# Patient Record
Sex: Male | Born: 1940
Health system: Southern US, Community
[De-identification: ages and names within clinical notes are randomized; demographics above are authoritative.]

## PROBLEM LIST (undated history)

## (undated) DIAGNOSIS — E079 Disorder of thyroid, unspecified: Secondary | ICD-10-CM

## (undated) DIAGNOSIS — I10 Essential (primary) hypertension: Secondary | ICD-10-CM

## (undated) DIAGNOSIS — K219 Gastro-esophageal reflux disease without esophagitis: Secondary | ICD-10-CM

## (undated) DIAGNOSIS — E039 Hypothyroidism, unspecified: Secondary | ICD-10-CM

## (undated) DIAGNOSIS — R0789 Other chest pain: Secondary | ICD-10-CM

## (undated) DIAGNOSIS — N400 Enlarged prostate without lower urinary tract symptoms: Secondary | ICD-10-CM

## (undated) DIAGNOSIS — M1990 Unspecified osteoarthritis, unspecified site: Secondary | ICD-10-CM

## (undated) DIAGNOSIS — E785 Hyperlipidemia, unspecified: Secondary | ICD-10-CM

## (undated) HISTORY — DX: Unspecified osteoarthritis, unspecified site: M19.90

## (undated) HISTORY — PX: PROSTATE BIOPSY: SHX241

## (undated) HISTORY — DX: Disorder of thyroid, unspecified: E07.9

## (undated) HISTORY — PX: COLONOSCOPY W/ POLYPECTOMY: SHX1380

## (undated) HISTORY — PX: BIOPSY SHOULDER: PRO31

## (undated) HISTORY — DX: Hyperlipidemia, unspecified: E78.5

## (undated) HISTORY — DX: Gastro-esophageal reflux disease without esophagitis: K21.9

## (undated) HISTORY — PX: CARDIAC CATHETERIZATION: SHX172

## (undated) HISTORY — DX: Essential (primary) hypertension: I10

---

## 1999-04-30 ENCOUNTER — Inpatient Hospital Stay (HOSPITAL_COMMUNITY): Admission: EM | Admit: 1999-04-30 | Discharge: 1999-05-03 | Payer: Self-pay | Admitting: Emergency Medicine

## 1999-04-30 ENCOUNTER — Encounter: Payer: Self-pay | Admitting: Emergency Medicine

## 1999-05-01 ENCOUNTER — Encounter: Payer: Self-pay | Admitting: Neurosurgery

## 1999-05-02 ENCOUNTER — Encounter: Payer: Self-pay | Admitting: Neurosurgery

## 1999-06-04 ENCOUNTER — Encounter: Payer: Self-pay | Admitting: Neurosurgery

## 1999-06-04 ENCOUNTER — Encounter: Admission: RE | Admit: 1999-06-04 | Discharge: 1999-06-04 | Payer: Self-pay | Admitting: Neurosurgery

## 1999-06-08 ENCOUNTER — Other Ambulatory Visit: Admission: RE | Admit: 1999-06-08 | Discharge: 1999-06-08 | Payer: Self-pay | Admitting: Urology

## 1999-07-02 ENCOUNTER — Encounter: Payer: Self-pay | Admitting: Neurosurgery

## 1999-07-02 ENCOUNTER — Encounter: Admission: RE | Admit: 1999-07-02 | Discharge: 1999-07-02 | Payer: Self-pay | Admitting: Neurosurgery

## 1999-07-24 ENCOUNTER — Encounter: Admission: RE | Admit: 1999-07-24 | Discharge: 1999-07-24 | Payer: Self-pay | Admitting: Neurosurgery

## 1999-07-24 ENCOUNTER — Encounter: Payer: Self-pay | Admitting: Neurosurgery

## 1999-08-27 ENCOUNTER — Encounter: Payer: Self-pay | Admitting: Neurosurgery

## 1999-08-27 ENCOUNTER — Encounter: Admission: RE | Admit: 1999-08-27 | Discharge: 1999-08-27 | Payer: Self-pay | Admitting: Neurosurgery

## 2001-11-17 ENCOUNTER — Ambulatory Visit (HOSPITAL_COMMUNITY): Admission: RE | Admit: 2001-11-17 | Discharge: 2001-11-17 | Payer: Self-pay | Admitting: Gastroenterology

## 2001-11-17 ENCOUNTER — Encounter (INDEPENDENT_AMBULATORY_CARE_PROVIDER_SITE_OTHER): Payer: Self-pay | Admitting: Specialist

## 2001-11-17 ENCOUNTER — Encounter: Payer: Self-pay | Admitting: Gastroenterology

## 2001-11-17 DIAGNOSIS — K5730 Diverticulosis of large intestine without perforation or abscess without bleeding: Secondary | ICD-10-CM | POA: Insufficient documentation

## 2004-09-29 ENCOUNTER — Emergency Department (HOSPITAL_COMMUNITY): Admission: EM | Admit: 2004-09-29 | Discharge: 2004-09-29 | Payer: Self-pay | Admitting: Emergency Medicine

## 2004-10-15 ENCOUNTER — Inpatient Hospital Stay (HOSPITAL_BASED_OUTPATIENT_CLINIC_OR_DEPARTMENT_OTHER): Admission: RE | Admit: 2004-10-15 | Discharge: 2004-10-15 | Payer: Self-pay | Admitting: Cardiovascular Disease

## 2007-06-20 ENCOUNTER — Emergency Department (HOSPITAL_COMMUNITY): Admission: EM | Admit: 2007-06-20 | Discharge: 2007-06-20 | Payer: Self-pay | Admitting: Emergency Medicine

## 2007-06-24 ENCOUNTER — Ambulatory Visit: Payer: Self-pay | Admitting: Gastroenterology

## 2007-06-25 ENCOUNTER — Ambulatory Visit (HOSPITAL_COMMUNITY): Admission: RE | Admit: 2007-06-25 | Discharge: 2007-06-25 | Payer: Self-pay | Admitting: Gastroenterology

## 2007-06-25 ENCOUNTER — Encounter: Payer: Self-pay | Admitting: Gastroenterology

## 2007-06-29 ENCOUNTER — Ambulatory Visit: Payer: Self-pay | Admitting: Gastroenterology

## 2007-08-29 DIAGNOSIS — K449 Diaphragmatic hernia without obstruction or gangrene: Secondary | ICD-10-CM | POA: Insufficient documentation

## 2007-08-29 DIAGNOSIS — I10 Essential (primary) hypertension: Secondary | ICD-10-CM | POA: Insufficient documentation

## 2007-08-29 DIAGNOSIS — K219 Gastro-esophageal reflux disease without esophagitis: Secondary | ICD-10-CM | POA: Insufficient documentation

## 2007-08-29 DIAGNOSIS — D126 Benign neoplasm of colon, unspecified: Secondary | ICD-10-CM | POA: Insufficient documentation

## 2007-08-29 DIAGNOSIS — E039 Hypothyroidism, unspecified: Secondary | ICD-10-CM | POA: Insufficient documentation

## 2007-08-29 DIAGNOSIS — K648 Other hemorrhoids: Secondary | ICD-10-CM | POA: Insufficient documentation

## 2007-08-29 DIAGNOSIS — E7800 Pure hypercholesterolemia, unspecified: Secondary | ICD-10-CM | POA: Insufficient documentation

## 2010-12-18 NOTE — Consult Note (Signed)
NAME:  Hunter Key, SALSGIVER NO.:  [id number]   MEDICAL RECORD NO.:  [id number]          PATIENT TYPE:  EMS   LOCATION:  MAJO                         FACILITY:  [facility]   PHYSICIAN:  Gaspar Garbe, M.D.DATE OF BIRTH:  08-Oct-1940   DATE OF CONSULTATION:  DATE OF DISCHARGE:                                 CONSULTATION   REQUESTING PHYSICIAN:  Bethann Berkshire, MD, ER physician.   PRIMARY PHYSICIAN:  Dr. Wylene Simmer.   CHIEF COMPLAINT:  Abdominal/chest pain.   HISTORY OF PRESENT ILLNESS:  The patient is a 70 year old white male  with a history of recent onset gastroesophageal reflux disease.  I have  seen the patient for this in the office within the past 2 weeks.  At  that time, I recommended over-the-counter Prilosec and Zantac.  He is  somewhat taking these medications, but it has not made his symptoms go  away altogether.  He has had an EKG in the office which has been  negative.  He has also had negative EKG and cardiac enzymes here in the  hospital.  He indicates food seems like it gets stuck sometimes, and his  wife has been giving him herbal medicines to help with gas.  She  specifically brought him to the hospital today for the purpose of him  being evaluated by a gastroenterologist.  I indicated to her that given  that this is not an emergency situation, he would not having any sort of  procedural work done until at least Monday, and given that he is  otherwise healthy per workup below, that he does not fit criteria for  hospitalization.  He was essentially pain-free during the time of this  examination and consultation.   ALLERGIES:  No known drug allergies.   MEDICATIONS:  1. Synthroid 137 mcg q. day.  2. Hydrochlorothiazide 25 mg q. day.  3. Lipitor 10 mg 1. day.  4. Quinapril 20 mg p.o. q. day.   PAST MEDICAL HISTORY:  1. Hypertension.  2. Hypothyroidism.  3. Hyperlipidemia.  4. Recent GERD.   SOCIAL HISTORY:  The patient lives in [place] with  his wife.  He  works at a [employer].  He is a nonsmoker and very rare  drinker.   FAMILY HISTORY:  His mother died at age 43 of pneumonia.  Father died at  age 72 of a stroke.   REVIEW OF SYSTEMS:  The patient had complained of some chest pain and  GERD symptoms, but these have completely resolved at this point.  A 10-  point review of systems is otherwise negative.  He has asterixis.  The  patient is a full code.   PHYSICAL EXAM:  Temperature 98.2, pulse 103, respiratory rate 18, blood  pressure 126/82.  GENERAL:  No acute distress.  HEENT:  Normocephalic, atraumatic.  PERRLA.  EOMI.  ENT is within normal  limits.  NECK:  Supple.  No lymphadenopathy, JVD or bruit.  HEART:  Regular rate and rhythm.  No murmur, rub or gallop.  LUNGS:  Clear to auscultation bilaterally.  ABDOMEN:  Soft, nontender, normoactive bowel sounds.  No  hepatosplenomegaly.  EXTREMITIES:  No clubbing, cyanosis or edema.   EKG shows a rate of 113 with an old left anterior fascicular block.  No  other acuity is noted.  Cardiac enzymes:  myoglobin 116,  CK-MB less  than 1, troponin less 0.05.  White count 8.2 with no left shift,  hemoglobin 16.8, hematocrit 49.2, platelets 273.  BUN and creatinine are  20 and 1.3 respectively.  Sodium is slightly decreased at 134.  Potassium slightly decreased at 3.3.  This was replaced with 20 mEq of  p.o. potassium in the emergency room.  Bilirubin 1.6, AST 23, ALT 19,  lipase 20, total protein seven, albumin 4.1.   ASSESSMENT AND PLAN:  1. Gastroesophageal reflux disease.  I have written the patient a      prescription for Protonix 40 mg daily to take approximately 1/2      hour before his largest meal of the day.  He may use Maalox or      Mylanta as needed.  In addition, he has noted some frequent      increased frequency of bowel movements over the weekend.  I am not      sure whether he is getting a viral gastroenteritis, or if it is      related to some of  the nonabsorbable sugars which are in his anti-      gas over-the-counter medication, which his wife happened to have on-      hand.  I have asked them to discontinue this, as well as to hold      his hydrochlorothiazide for 3 days, as this may potentiate      hypokalemia as well.  I will have my nurse actually contact him on      Monday for referral for a gastroenterologist, as I believe he would      warrant an EGD, and I believe he may end up having a stricture      which requires dilatation.  I spoke at length with the wife about      guidelines for admission, and indicated that given his lack of      acuity, he would not be able to undergo one of these procedures      over the weekend.  She was agreeable to this and agreeable to take      home with followup.  2. Hypokalemia, possibly secondary to viral gastroenteritis or      supplement that the wife has been giving him.  I have asked them to      discontinue these.  Potassium was replaced in the emergency room.  3. Hypertension.  Continue of quinapril.  4. Hyperlipidemia.  Continue Lipitor.  5. Hypothyroidism.  Continue Synthroid.  The patient has had recent      check of TSH in the office.   Thank you for allowing me to consult on this patient.      Gaspar Garbe, M.D.  Electronically Signed     RWT/MEDQ  D:  06/20/2007  T:  06/22/2007  Job:  [id number]

## 2010-12-18 NOTE — Assessment & Plan Note (Signed)
[facility]                         GASTROENTEROLOGY OFFICE NOTE   Hunter Key, Hunter Key                      MRN:          [id number]  DATE:06/24/2007                            DOB:          [birth year]    PRIMARY CARE PHYSICIAN:  Dr. Wylene Simmer.   REASON FOR REFERRAL:  Dr. Wylene Simmer asked me to evaluate Hunter Key in  consultation regarding intermittent pyrosis and recent chest pains.   HISTORY OF PRESENT ILLNESS:  Hunter Key is a very pleasant 70 year old  man who has had at least 5 to 10 years of intermittent pyrosis, pressure  feeling in his chest, belching sensation.  In the past he has taken  baking soda and this will oftentimes help.  In the past 2 to 3 weeks it  seems to be worse.  He does admit that since he was laid off in July he  has been drinking quite a bit more caffeine than usual, and is drinking  much more alcohol than usual.  He stopped both of those habits about 2  to 3 weeks ago.  He did present to the emergency room in the past 2  weeks with acute diarrhea.  Dr. Wylene Simmer saw him while he was in the  emergency room, though he probably had a viral gastroenteritis with  recent worsening of gastroesophageal reflux disease symptoms.  He was  given prescription for Protonix, and he has been taking that on a daily  basis 20 to 30 minutes prior to his dinner meal.  He says with this  regimen of the Protonix on a daily basis and stopping the alcohol and  cutting back on caffeine dramatically, he has noticed a definite  improvement in his pyrosis and his chest pains.  He did have an EKG and  cardiac markers while he was in the emergency room and he tells me those  were normal.   REVIEW OF SYSTEMS:  Notable for stable weight, otherwise essentially  normal and are available on his nursing intake sheet.   PAST MEDICAL HISTORY:  1. Hypertension.  2. Elevated cholesterol.  3. Hypothyroidism.  4. Personal history of colon tubular adenomas.   Several polyps were      removed by Dr. Ritta Slot in 2003.  These were adenomatous.  Dr.      Kinnie Scales did a repeat colonoscopy last year and found, per the      patient, 2 more polyps, and he has arranged to see Dr. Kinnie Scales every      2 to 3 years for repeat colonoscopy.   CURRENT MEDICATIONS:  1. Synthroid.  2. Quinapril.  3. Lipitor.  4. Hydrochlorothiazide.  5. Pantoprazole.   ALLERGIES:  No known drug allergies.   SOCIAL HISTORY:  Married with 1 daughter.  Laid off in July.  Dramatic  increase in his caffeine and alcohol intake following being laid off,  but he quit both of those for the past 2 to 3 weeks.  Nonsmoker,  nondrinker currently.   FAMILY HISTORY:  Brothers have heart disease.  No colon cancer, colon  polyps in the family besides his own colon  polyps as described above.   PHYSICAL EXAMINATION:  Height 6 feet 2 inches, 186 pounds, blood  pressure 130/72, pulse 80.  CONSTITUTIONAL:  Generally well appearing.  NEUROLOGIC:  Alert and oriented x3.  EYES:  Extraocular movements intact.  MOUTH:  Oropharynx moist, no lesions.  NECK:  Supple, no lymphadenopathy.  CARDIOVASCULAR:  Heart regular rate and rhythm.  LUNGS:  Clear to auscultation bilaterally.  ABDOMEN:  Soft, nontender, nondistended, normal bowel sounds.  EXTREMITIES:  No lower extremity edema.  SKIN:  No rashes or lesions on visible extremities.   ASSESSMENT AND PLAN:  A 70 year old man with chronic gastroesophageal  reflux disease symptoms, worse recently.   First, his alcohol and caffeine intake since he was laid off probably  did play a role in his recent worsening of his gastroesophageal reflux  disease symptoms.  He has cut back on both of those habits, and does not  think it would be hard for him to completely avoid alcohol.  I suspect  that his pyrosis and probably most of his chest pains are  gastroesophageal reflux disease related.  He will continue to avoid the  alcohol and caffeine.  I think  he should continue on the proton pump  inhibitor 20 to 30 minutes prior to his dinner meal as he has been  taking lately as it seems very effective.  I will arrange for him to  have an esophagogastroduodenoscopy performed at his soonest convenience  to screen him for chronic complications of gastroesophageal reflux  disease such as Barrett's or stricturing.  I did not mention above, but  he has no dysphagia, so I doubt he will have any peptic stricturing.  He  also has had recent labs in the past week or two showing that he is not  anemic.  Hunter Key has had personal history of colon polyps, and his  last colonoscopy was in 2007 by Dr. Kinnie Scales, and Hunter Key wishes to  remain with Dr. Kinnie Scales for future colonoscopy care, so we will leave  that issue to Dr. Kinnie Scales and I will forward this report to Dr. Kinnie Scales as  well.  I see no return for any further blood tests or imaging studies.  Hunter Key has been given a gastroesophageal reflux disease handout.     Rachael Fee, MD  Electronically Signed    DPJ/MedQ  DD: 06/24/2007  DT: 06/24/2007  Job #: [id number]   cc:   Gaspar Garbe, M.D.  Griffith Citron, M.D.

## 2010-12-21 NOTE — Cardiovascular Report (Signed)
NAME:  SIMRAN, MANNIS NO.:  [id number]   MEDICAL RECORD NO.:  [id number]          PATIENT TYPE:  OIB   LOCATION:  6501                         FACILITY:  [facility]   PHYSICIAN:  Vesta Mixer, M.D. DATE OF BIRTH:  22-Jun-1941   DATE OF PROCEDURE:  10/15/2004  DATE OF DISCHARGE:                              CARDIAC CATHETERIZATION   Mr. Huckins is a 63-year gentleman. He was originally referred for some left  arm discomfort and chest discomfort. He had a stress Cardiolite study which  revealed evidence of reversible ischemia in the inferoseptal region. He is  referred for heart catheterization based on these findings.   PROCEDURE:  Left heart catheterization with coronary angiography. The right  femoral artery was easily cannulated using a modified Seldinger technique.   HEMODYNAMIC:  The LV pressure was 118/11 with an aortic pressure of 116/59.   ANGIOGRAPHY:  Left main: The left main is smooth and normal.   The left anterior descending artery is smooth and normal. It gives off  several moderate sized diagonal branches all of which were normal.   The distal LAD terminates at the apex and is normal.   The first and second and third diagonal vessels are all normal.   The circumflex artery is a fairly large vessel. It gives off a very large  obtuse marginal artery across and then terminates as a second obtuse  marginal artery. All of these branches were normal.   The right coronary artery is large and dominant. It is smooth and normal  throughout its course. The posterior descending artery and posterolateral  branches are normal.   The left ventriculogram was performed in a 30 RAO position. It revealed  normal left ventricular systolic function. There is an ejection fraction of  around 65%. There is no mitral regurgitation.   COMPLICATIONS:  None.   CONCLUSIONS:  1.  Smooth and normal coronary arteries.  2.  Normal left ventricular systolic  function.      PJN/MEDQ  D:  10/15/2004  T:  10/15/2004  Job:  [id number]   cc:   Gaspar Garbe, M.D.  [address]  [place]  [place] [address]  Fax: [phone]

## 2010-12-21 NOTE — H&P (Signed)
NAME:  Hunter Key, Hunter Key NO.:  [id number]   MEDICAL RECORD NO.:  [id number]          PATIENT TYPE:  AMB   LOCATION:                               FACILITY:  [facility]   PHYSICIAN:  Vesta Mixer, M.D. DATE OF BIRTH:  February 17, 1941   DATE OF ADMISSION:  10/15/2004  DATE OF DISCHARGE:                                HISTORY & PHYSICAL   Hunter Key is a 70 year old gentleman with a history of hypertension,  hypothyroidism, and hypercholesterolemia.  He is admitted now for  catheterization after having an abnormal Cardiolite study.   He has been having some numbness and tingling in his left arm associated  with some chest fullness.  He also has been having some episodes of  indigestion.  These episodes have been recurring at various times.  They  have not been necessarily associated with any physical exertion.  He admits  to not getting a lot of regular exercise.  He has these episodes of pain but  with exertion and at rest.  He denies any episodes of syncope or presyncope.  He denies any significant dyspnea.   CURRENT MEDICATIONS:  1.  Hydrochlorothiazide 12.5 mg a day.  2.  Quinapril 20 mg a day.  3.  Lipitor 10 mg a day.  4.  Synthroid 0.13 mg a day.  5  Saw Palmetto once a day.  1.  Zinc 50 mg a day.  2.  Multivitamins once a day.   ALLERGIES:  None.   PAST MEDICAL HISTORY:  1.  Hypertension.  2.  Hypothyroidism.  3.  Hypercholesterolemia.   SOCIAL HISTORY:  The patient is a nonsmoker.  He drinks alcohol only rarely.  He works for a [employer].   FAMILY HISTORY:  His father died at age 78 due to a CVA.  Mother died at age  62 due to pneumonia.   REVIEW OF SYSTEMS:  Was reviewed and is essentially negative.   PHYSICAL EXAMINATION:  GENERAL:  He is a middle-aged gentleman in no acute  distress.  He is alert and oriented x 3, and his mood and affect are normal.  VITAL SIGNS:  Weight 182.  Blood pressure 130/94 with heart rate of 84.  HEAD AND NECK:   2+ carotids.  No bruits, no JVD, no thyromegaly.  LUNGS:  Clear to auscultation.  HEART:  Regular rate, S1, S2.  He has no murmurs, gallops, or rubs.  ABDOMEN:  Good bowel sounds and nontender.  EXTREMITIES:  No clubbing, cyanosis, or edema.  NEUROLOGIC:  Exam is nonfocal.   LABORATORY DATA AND OTHER STUDIES:  EKG reveals normal sinus rhythm.  He has  no ST or T wave changes to suggest ischemia.   Stress Cardiolite study reveals evidence of reversible ischemia in the  inferior septal region.   IMPRESSION:  Hunter Key presents with episodes of left arm tingling and  numbness associated with some chest pressure.  He has inferior septal  ischemia by Cardiolite scanning.  I have recommended that we proceed with  heart catheterization.  I have discussed the risks, benefits, and options of  heart catheterization.  He understands and agrees to proceed.  I have  recommended that we proceed with an outpatient heart catheterization.  We  will schedule the test for next Monday.  We will draw basic laboratory work.       ___________________________________________  Vesta Mixer, M.D.    PJN/MEDQ  D:  10/09/2004  T:  10/09/2004  Job:  [id number]   cc:   Outpatient catheterizatoin Lab,6th Floor   Gaspar Garbe, M.D.  [address]  [place]  [place] [address]  Fax: [phone]

## 2011-05-14 LAB — CBC
HCT: 49
Hemoglobin: 16.6
Hemoglobin: 16.8
RBC: 5.25
RBC: 5.25
WBC: 8.2

## 2011-05-14 LAB — DIFFERENTIAL
Basophils Relative: 0
Lymphocytes Relative: 7 — ABNORMAL LOW
Monocytes Absolute: 0.2
Monocytes Relative: 3
Neutro Abs: 7.4
Neutrophils Relative %: 90 — ABNORMAL HIGH

## 2011-05-14 LAB — COMPREHENSIVE METABOLIC PANEL
ALT: 19
Alkaline Phosphatase: 66
BUN: 20
CO2: 28
Chloride: 96
GFR calc non Af Amer: 51 — ABNORMAL LOW
Glucose, Bld: 121 — ABNORMAL HIGH
Potassium: 3.3 — ABNORMAL LOW
Sodium: 134 — ABNORMAL LOW
Total Bilirubin: 1.6 — ABNORMAL HIGH

## 2011-05-14 LAB — POCT CARDIAC MARKERS
CKMB, poc: 1.4
CKMB, poc: <1 — ABNORMAL LOW
Myoglobin, poc: 116
Troponin i, poc: <0.05
Troponin i, poc: <0.05

## 2011-09-06 ENCOUNTER — Ambulatory Visit (INDEPENDENT_AMBULATORY_CARE_PROVIDER_SITE_OTHER): Payer: Self-pay | Admitting: General Surgery

## 2011-10-02 ENCOUNTER — Ambulatory Visit (INDEPENDENT_AMBULATORY_CARE_PROVIDER_SITE_OTHER): Payer: Medicare HMO | Admitting: General Surgery

## 2011-10-02 ENCOUNTER — Encounter (INDEPENDENT_AMBULATORY_CARE_PROVIDER_SITE_OTHER): Payer: Self-pay | Admitting: General Surgery

## 2011-10-02 ENCOUNTER — Encounter (HOSPITAL_COMMUNITY): Payer: Self-pay | Admitting: Respiratory Therapy

## 2011-10-02 VITALS — BP 142/88 | Temp 98.5°F | Resp 16 | Ht 74.0 in | Wt 182.2 lb

## 2011-10-02 DIAGNOSIS — K4020 Bilateral inguinal hernia, without obstruction or gangrene, not specified as recurrent: Secondary | ICD-10-CM

## 2011-10-02 NOTE — Progress Notes (Signed)
Patient ID: Hunter Key, male   DOB: January 13, 1941, 71 y.o.   MRN: [id number]  Chief Complaint  Patient presents with  . Pre-op Exam    eval LIH    HPI Hunter Key is a 71 y.o. male.  This patient was referred by Dr. Wylene Simmer for evaluation of a left inguinal hernia. He states that the hernias been present for several years and he has known about for quite some time but it has never caused any problems up to this point. He still has very little symptoms from this although he does have some occasional discomfort which he describes as "gassy" in the left groin region but otherwise is relatively asymptomatic. He brought this up to this position and wanted surgical evaluation because he has been increasing in size and has now encroached upon his scrotum. He has no symptoms such as nausea vomiting or constipation. He does have some difficulty with urination. He denies any heart problems. HPI  Past Medical History  Diagnosis Date  . Arthritis   . GERD (gastroesophageal reflux disease)   . Hyperlipidemia   . Hypertension   . Thyroid disease     History reviewed. No pertinent past surgical history.  History reviewed. No pertinent family history.  Social History History  Substance Use Topics  . Smoking status: Former Games developer  . Smokeless tobacco: Former Neurosurgeon    Quit date: 10/01/1962  . Alcohol Use: No    No Known Allergies  Current Outpatient Prescriptions  Medication Sig Dispense Refill  . atorvastatin (LIPITOR) 10 MG tablet       . omeprazole (PRILOSEC) 20 MG capsule       . quinapril (ACCUPRIL) 20 MG tablet       . SYNTHROID 175 MCG tablet         Review of Systems Review of Systems All other review of systems negative or noncontributory except as stated in the HPI  Blood pressure 142/88, temperature 98.5 F (36.9 C), temperature source Temporal, resp. rate 16, height 6\' 2"  (1.88 m), weight 182 lb 3.2 oz (82.645 kg).  Physical Exam Physical Exam Physical Exam  Vitals  reviewed. Constitutional: He is oriented to person, place, and time. He appears well-developed and well-nourished. No distress.  HENT:  Head: Normocephalic and atraumatic.  Mouth/Throat: No oropharyngeal exudate.  Eyes: Conjunctivae and EOM are normal. Pupils are equal, round, and reactive to light. Right eye exhibits no discharge. Left eye exhibits no discharge. No scleral icterus.  Neck: Normal range of motion. No tracheal deviation present.  Cardiovascular: Normal rate, regular rhythm and normal heart sounds.   Pulmonary/Chest: Effort normal and breath sounds normal. No stridor. No respiratory distress. He has no wheezes. He has no rales. He exhibits no tenderness.  Abdominal: Soft. Bowel sounds are normal. He exhibits no distension and no mass. There is no tenderness. There is no rebound and no guarding. He has a large, reducible left inguinal hernia and scrotal hernia. This is nontender. On the right he also has a small reducible hernia. There is no umbilical hernia. Musculoskeletal: Normal range of motion. He exhibits no edema and no tenderness.  Neurological: He is alert and oriented to person, place, and time.  Skin: Skin is warm and dry. No rash noted. He is not diaphoretic. No erythema. No pallor.  Psychiatric: He has a normal mood and affect. His behavior is normal. Judgment and thought content normal.   Data Reviewed   Assessment    Bilateral inguinal hernias with a  large left scrotal hernia Though he does have bilateral inguinal hernias, I do not think that he is a very good candidate for laparoscopic repair given the large scrotal left inguinal hernia. We did discuss the options of laparoscopic and open repair and have recommended open left inguinal hernia repair given the size of the defect in the scrotal nature. I think that this would be too difficult to reduce laparoscopically. He does have a small right inguinal hernia as well but this is also asymptomatic and I would probably  just fix his left side a first. We discussed the procedure including the risks of infection, bleeding, pain, scarring, recurrence, injury to testicle and vas deferens, injury to bowel and he expressed understanding and desires to proceed with open left inguinal hernia repair.    Plan    We will set him up for open left inguinal hernia when convenient for him.         Lodema Pilot DAVID 10/02/2011, 9:10 AM

## 2011-10-07 NOTE — Pre-Procedure Instructions (Signed)
20 Hunter Key  10/07/2011   Your procedure is scheduled on: Thursday October 10, 2011 at 0730 am.  Report to [facility] at 0530 AM.  Call this number if you have problems the morning of surgery: [phone]   Remember:   Do not eat food:After Midnight.  May have clear liquids: up to 4 Hours before arrival until 0130 am.  Clear liquids include soda, tea, black coffee, apple or grape juice, broth.  Take these medicines the morning of surgery with A SIP OF WATER: Prilosec and Synthroid   Do not wear jewelry, make-up or nail polish.  Do not wear lotions, powders, or perfumes. You may wear deodorant.  Do not shave 48 hours prior to surgery.  Do not bring valuables to the hospital.  Contacts, dentures or bridgework may not be worn into surgery.  Leave suitcase in the car. After surgery it may be brought to your room.  For patients admitted to the hospital, checkout time is 11:00 AM the day of discharge.   Patients discharged the day of surgery will not be allowed to drive home.  Name and phone number of your driver:   Special Instructions: CHG Shower Use Special Wash: 1/2 bottle night before surgery and 1/2 bottle morning of surgery.   Please read over the following fact sheets that you were given: Pain Booklet, Coughing and Deep Breathing and Surgical Site Infection Prevention

## 2011-10-08 ENCOUNTER — Encounter (HOSPITAL_COMMUNITY)
Admission: RE | Admit: 2011-10-08 | Discharge: 2011-10-08 | Disposition: A | Discharge status: Home / Self Care | Payer: Medicare HMO | Source: Ambulatory Visit | Attending: General Surgery | Admitting: General Surgery

## 2011-10-08 ENCOUNTER — Encounter (HOSPITAL_COMMUNITY): Payer: Self-pay

## 2011-10-08 DIAGNOSIS — K4020 Bilateral inguinal hernia, without obstruction or gangrene, not specified as recurrent: Secondary | ICD-10-CM

## 2011-10-08 HISTORY — DX: Hypothyroidism, unspecified: E03.9

## 2011-10-08 LAB — CBC
HCT: 46.4 % (ref 39.0–52.0)
Hemoglobin: 15.6 g/dL (ref 13.0–17.0)
MCHC: 33.6 g/dL (ref 30.0–36.0)
RDW: 12.8 % (ref 11.5–15.5)
WBC: 5.7 10*3/uL (ref 4.0–10.5)

## 2011-10-08 LAB — BASIC METABOLIC PANEL
BUN: 12 mg/dL (ref 6–23)
Chloride: 106 mEq/L (ref 96–112)
Creatinine, Ser: 1.06 mg/dL (ref 0.50–1.35)
GFR calc Af Amer: 79 mL/min — ABNORMAL LOW (ref >90)
GFR calc non Af Amer: 68 mL/min — ABNORMAL LOW (ref >90)
Potassium: 4.1 mEq/L (ref 3.5–5.1)

## 2011-10-08 LAB — SURGICAL PCR SCREEN
MRSA, PCR: NEGATIVE
Staphylococcus aureus: NEGATIVE

## 2011-10-08 NOTE — Progress Notes (Signed)
Pt reported having a stress test and catheterization but was unaware of where it was preformed at. Pt also reported having a EKG and CXR from Dr. Deneen Harts office. Records requested. Pt denied having a cardiologist.

## 2011-10-09 MED ORDER — CEFAZOLIN SODIUM-DEXTROSE 2-3 GM-% IV SOLR
2.0000 g | INTRAVENOUS | Status: AC
  Administered 2011-10-10: 2 g via INTRAVENOUS
  Filled 2011-10-09: qty 50

## 2011-10-10 ENCOUNTER — Encounter (HOSPITAL_COMMUNITY): Payer: Self-pay | Admitting: Anesthesiology

## 2011-10-10 ENCOUNTER — Ambulatory Visit (HOSPITAL_COMMUNITY)
Admission: RE | Admit: 2011-10-10 | Discharge: 2011-10-10 | Disposition: A | Discharge status: Home / Self Care | Payer: Medicare HMO | Source: Ambulatory Visit | Attending: General Surgery | Admitting: General Surgery

## 2011-10-10 ENCOUNTER — Encounter (HOSPITAL_COMMUNITY): Payer: Self-pay | Admitting: *Deleted

## 2011-10-10 ENCOUNTER — Encounter (HOSPITAL_COMMUNITY)
Admission: RE | Disposition: A | Discharge status: Home / Self Care | Payer: Self-pay | Source: Ambulatory Visit | Attending: General Surgery

## 2011-10-10 ENCOUNTER — Ambulatory Visit (HOSPITAL_COMMUNITY): Payer: Medicare HMO | Admitting: Anesthesiology

## 2011-10-10 DIAGNOSIS — I10 Essential (primary) hypertension: Secondary | ICD-10-CM | POA: Insufficient documentation

## 2011-10-10 DIAGNOSIS — K4020 Bilateral inguinal hernia, without obstruction or gangrene, not specified as recurrent: Secondary | ICD-10-CM

## 2011-10-10 DIAGNOSIS — Z01812 Encounter for preprocedural laboratory examination: Secondary | ICD-10-CM | POA: Insufficient documentation

## 2011-10-10 DIAGNOSIS — K08409 Partial loss of teeth, unspecified cause, unspecified class: Secondary | ICD-10-CM | POA: Insufficient documentation

## 2011-10-10 DIAGNOSIS — E039 Hypothyroidism, unspecified: Secondary | ICD-10-CM | POA: Insufficient documentation

## 2011-10-10 DIAGNOSIS — K4090 Unilateral inguinal hernia, without obstruction or gangrene, not specified as recurrent: Secondary | ICD-10-CM

## 2011-10-10 DIAGNOSIS — M129 Arthropathy, unspecified: Secondary | ICD-10-CM | POA: Insufficient documentation

## 2011-10-10 DIAGNOSIS — K219 Gastro-esophageal reflux disease without esophagitis: Secondary | ICD-10-CM | POA: Insufficient documentation

## 2011-10-10 HISTORY — PX: HERNIA REPAIR: SHX51

## 2011-10-10 HISTORY — PX: INGUINAL HERNIA REPAIR: SHX194

## 2011-10-10 SURGERY — REPAIR, HERNIA, INGUINAL, ADULT
Anesthesia: General | Site: Groin | Wound class: Clean

## 2011-10-10 MED ORDER — BUPIVACAINE-EPINEPHRINE PF 0.5-1:200000 % IJ SOLN
INTRAMUSCULAR | Status: DC | PRN
  Administered 2011-10-10: 150 mg

## 2011-10-10 MED ORDER — LIDOCAINE-EPINEPHRINE 1 %-1:100000 IJ SOLN
INTRAMUSCULAR | Status: DC | PRN
  Administered 2011-10-10: 20 mL

## 2011-10-10 MED ORDER — HYDROMORPHONE HCL PF 1 MG/ML IJ SOLN: 0.2500 mg | INTRAMUSCULAR | Status: DC | PRN

## 2011-10-10 MED ORDER — HYDROCODONE-ACETAMINOPHEN 5-325 MG PO TABS: 1.0000 | ORAL_TABLET | ORAL | Status: AC | PRN

## 2011-10-10 MED ORDER — FENTANYL CITRATE 0.05 MG/ML IJ SOLN
INTRAMUSCULAR | Status: DC | PRN
  Administered 2011-10-10: 50 ug via INTRAVENOUS
  Administered 2011-10-10: 100 ug via INTRAVENOUS
  Administered 2011-10-10: 50 ug via INTRAVENOUS

## 2011-10-10 MED ORDER — BUPIVACAINE HCL (PF) 0.25 % IJ SOLN
INTRAMUSCULAR | Status: DC | PRN
  Administered 2011-10-10: 30 mL

## 2011-10-10 MED ORDER — LACTATED RINGERS IV SOLN
INTRAVENOUS | Status: DC | PRN
  Administered 2011-10-10 (×2): via INTRAVENOUS

## 2011-10-10 MED ORDER — EPHEDRINE SULFATE 50 MG/ML IJ SOLN
INTRAMUSCULAR | Status: DC | PRN
  Administered 2011-10-10: 5 mg via INTRAVENOUS
  Administered 2011-10-10 (×2): 10 mg via INTRAVENOUS

## 2011-10-10 MED ORDER — DROPERIDOL 2.5 MG/ML IJ SOLN: 0.6250 mg | INTRAMUSCULAR | Status: DC | PRN

## 2011-10-10 MED ORDER — ONDANSETRON HCL 4 MG/2ML IJ SOLN
INTRAMUSCULAR | Status: DC | PRN
  Administered 2011-10-10: 4 mg via INTRAVENOUS

## 2011-10-10 MED ORDER — MIDAZOLAM HCL 5 MG/5ML IJ SOLN
INTRAMUSCULAR | Status: DC | PRN
  Administered 2011-10-10 (×2): 1 mg via INTRAVENOUS

## 2011-10-10 MED ORDER — PROPOFOL 10 MG/ML IV BOLUS
INTRAVENOUS | Status: DC | PRN
  Administered 2011-10-10: 170 mg via INTRAVENOUS

## 2011-10-10 MED ORDER — PHENYLEPHRINE HCL 10 MG/ML IJ SOLN
INTRAMUSCULAR | Status: DC | PRN
  Administered 2011-10-10 (×2): 80 ug via INTRAVENOUS

## 2011-10-10 SURGICAL SUPPLY — 46 items
ADH SKN CLS APL DERMABOND .7 (GAUZE/BANDAGES/DRESSINGS) ×2
BLADE SURG 10 STRL SS (BLADE) ×3 IMPLANT
BLADE SURG 15 STRL LF DISP TIS (BLADE) ×2 IMPLANT
BLADE SURG 15 STRL SS (BLADE) ×3
BLADE SURG ROTATE 9660 (MISCELLANEOUS) ×1 IMPLANT
CANISTER SUCTION 2500CC (MISCELLANEOUS) ×2 IMPLANT
CHLORAPREP W/TINT 26ML (MISCELLANEOUS) ×3 IMPLANT
CLOTH BEACON ORANGE TIMEOUT ST (SAFETY) ×3 IMPLANT
COVER SURGICAL LIGHT HANDLE (MISCELLANEOUS) ×3 IMPLANT
DERMABOND ADVANCED (GAUZE/BANDAGES/DRESSINGS) ×1
DERMABOND ADVANCED .7 DNX12 (GAUZE/BANDAGES/DRESSINGS) ×2 IMPLANT
DRAIN PENROSE 1/2X12 LTX STRL (WOUND CARE) ×1 IMPLANT
DRAPE LAPAROSCOPIC ABDOMINAL (DRAPES) ×3 IMPLANT
ELECT CAUTERY BLADE 6.4 (BLADE) ×3 IMPLANT
ELECT REM PT RETURN 9FT ADLT (ELECTROSURGICAL) ×3
ELECTRODE REM PT RTRN 9FT ADLT (ELECTROSURGICAL) ×2 IMPLANT
GLOVE BIO SURGEON STRL SZ7.5 (GLOVE) ×3 IMPLANT
GLOVE BIOGEL PI IND STRL 7.5 (GLOVE) IMPLANT
GLOVE BIOGEL PI INDICATOR 7.5 (GLOVE) ×2
GLOVE SURG SS PI 7.5 STRL IVOR (GLOVE) ×8 IMPLANT
GOWN PREVENTION PLUS XLARGE (GOWN DISPOSABLE) ×4 IMPLANT
GOWN STRL NON-REIN LRG LVL3 (GOWN DISPOSABLE) ×4 IMPLANT
KIT BASIN OR (CUSTOM PROCEDURE TRAY) ×3 IMPLANT
KIT ROOM TURNOVER OR (KITS) ×3 IMPLANT
MESH ULTRAPRO 3X6 7.6X15CM (Mesh General) ×1 IMPLANT
NDL HYPO 25GX1X1/2 BEV (NEEDLE) ×2 IMPLANT
NEEDLE HYPO 25GX1X1/2 BEV (NEEDLE) ×3 IMPLANT
NS IRRIG 1000ML POUR BTL (IV SOLUTION) ×3 IMPLANT
PACK SURGICAL SETUP 50X90 (CUSTOM PROCEDURE TRAY) ×3 IMPLANT
PAD ARMBOARD 7.5X6 YLW CONV (MISCELLANEOUS) ×5 IMPLANT
PENCIL BUTTON HOLSTER BLD 10FT (ELECTRODE) ×3 IMPLANT
SPECIMEN JAR SMALL (MISCELLANEOUS) IMPLANT
SPONGE INTESTINAL PEANUT (DISPOSABLE) ×3 IMPLANT
SPONGE LAP 18X18 X RAY DECT (DISPOSABLE) ×3 IMPLANT
SUT MNCRL AB 4-0 PS2 18 (SUTURE) ×3 IMPLANT
SUT PROLENE 2 0 SH DA (SUTURE) ×13 IMPLANT
SUT VIC AB 2-0 SH 27 (SUTURE) ×9
SUT VIC AB 2-0 SH 27XBRD (SUTURE) ×4 IMPLANT
SUT VIC AB 3-0 SH 27 (SUTURE) ×3
SUT VIC AB 3-0 SH 27X BRD (SUTURE) ×2 IMPLANT
SYR BULB 3OZ (MISCELLANEOUS) ×3 IMPLANT
SYR CONTROL 10ML LL (SYRINGE) ×3 IMPLANT
TOWEL OR 17X24 6PK STRL BLUE (TOWEL DISPOSABLE) ×2 IMPLANT
TOWEL OR 17X26 10 PK STRL BLUE (TOWEL DISPOSABLE) ×3 IMPLANT
TUBE CONNECTING 12X1/4 (SUCTIONS) ×1 IMPLANT
YANKAUER SUCT BULB TIP NO VENT (SUCTIONS) ×1 IMPLANT

## 2011-10-10 NOTE — Anesthesia Postprocedure Evaluation (Signed)
Anesthesia Post Note  Patient: Hunter Key  Procedure(s) Performed: Procedure(s) (LRB): HERNIA REPAIR INGUINAL ADULT (Left) INSERTION OF MESH (N/A)  Anesthesia type: general  Patient location: PACU  Post pain: Pain level controlled  Post assessment: Patient's Cardiovascular Status Stable  Last Vitals:  Filed Vitals:   10/10/11 1033  BP: 121/82  Pulse: 86  Temp: 36.4 C  Resp: 18    Post vital signs: Reviewed and stable  Level of consciousness: sedated  Complications: No apparent anesthesia complications

## 2011-10-10 NOTE — H&P (View-Only) (Signed)
Patient ID: Hunter Key, male   DOB: 07/14/1941, 70 y.o.   MRN: [id number]  Chief Complaint  Patient presents with  . Pre-op Exam    eval [facility]    HPI Hunter Key is a 70 y.o. male.  This patient was referred by Dr. Tisovec for evaluation of a left inguinal hernia. He states that the hernias been present for several years and he has known about for quite some time but it has never caused any problems up to this point. He still has very little symptoms from this although he does have some occasional discomfort which he describes as "gassy" in the left groin region but otherwise is relatively asymptomatic. He brought this up to this position and wanted surgical evaluation because he has been increasing in size and has now encroached upon his scrotum. He has no symptoms such as nausea vomiting or constipation. He does have some difficulty with urination. He denies any heart problems. HPI  Past Medical History  Diagnosis Date  . Arthritis   . GERD (gastroesophageal reflux disease)   . Hyperlipidemia   . Hypertension   . Thyroid disease     History reviewed. No pertinent past surgical history.  History reviewed. No pertinent family history.  Social History History  Substance Use Topics  . Smoking status: Former Smoker  . Smokeless tobacco: Former User    Quit date: 10/01/1962  . Alcohol Use: No    No Known Allergies  Current Outpatient Prescriptions  Medication Sig Dispense Refill  . atorvastatin (LIPITOR) 10 MG tablet       . omeprazole (PRILOSEC) 20 MG capsule       . quinapril (ACCUPRIL) 20 MG tablet       . SYNTHROID 175 MCG tablet         Review of Systems Review of Systems All other review of systems negative or noncontributory except as stated in the HPI  Blood pressure 142/88, temperature 98.5 F (36.9 C), temperature source Temporal, resp. rate 16, height 6' 2" (1.88 m), weight 182 lb 3.2 oz (82.645 kg).  Physical Exam Physical Exam Physical Exam  Vitals  reviewed. Constitutional: He is oriented to person, place, and time. He appears well-developed and well-nourished. No distress.  HENT:  Head: Normocephalic and atraumatic.  Mouth/Throat: No oropharyngeal exudate.  Eyes: Conjunctivae and EOM are normal. Pupils are equal, round, and reactive to light. Right eye exhibits no discharge. Left eye exhibits no discharge. No scleral icterus.  Neck: Normal range of motion. No tracheal deviation present.  Cardiovascular: Normal rate, regular rhythm and normal heart sounds.   Pulmonary/Chest: Effort normal and breath sounds normal. No stridor. No respiratory distress. He has no wheezes. He has no rales. He exhibits no tenderness.  Abdominal: Soft. Bowel sounds are normal. He exhibits no distension and no mass. There is no tenderness. There is no rebound and no guarding. He has a large, reducible left inguinal hernia and scrotal hernia. This is nontender. On the right he also has a small reducible hernia. There is no umbilical hernia. Musculoskeletal: Normal range of motion. He exhibits no edema and no tenderness.  Neurological: He is alert and oriented to person, place, and time.  Skin: Skin is warm and dry. No rash noted. He is not diaphoretic. No erythema. No pallor.  Psychiatric: He has a normal mood and affect. His behavior is normal. Judgment and thought content normal.   Data Reviewed   Assessment    Bilateral inguinal hernias with a   large left scrotal hernia Though he does have bilateral inguinal hernias, I do not think that he is a very good candidate for laparoscopic repair given the large scrotal left inguinal hernia. We did discuss the options of laparoscopic and open repair and have recommended open left inguinal hernia repair given the size of the defect in the scrotal nature. I think that this would be too difficult to reduce laparoscopically. He does have a small right inguinal hernia as well but this is also asymptomatic and I would probably  just fix his left side a first. We discussed the procedure including the risks of infection, bleeding, pain, scarring, recurrence, injury to testicle and vas deferens, injury to bowel and he expressed understanding and desires to proceed with open left inguinal hernia repair.    Plan    We will set him up for open left inguinal hernia when convenient for him.         Milissa Fesperman DAVID 10/02/2011, 9:10 AM    

## 2011-10-10 NOTE — Interval H&P Note (Signed)
History and Physical Interval Note:  10/10/2011 7:13 AM  Hunter Key  has presented today for surgery, with the diagnosis of to repair defect in abdominal wall  The various methods of treatment have been discussed with the patient and family. After consideration of risks, benefits and other options for treatment, the patient has consented to  Procedure(s) (LRB): HERNIA REPAIR INGUINAL ADULT (Left) INSERTION OF MESH (N/A) as a surgical intervention .  The patients' history has been reviewed, patient examined, no change in status, stable for surgery.  I have reviewed the patients' chart and labs.  Questions were answered to the patient's satisfaction.  Site marked.  He has a large left inguinal hernia.  Risks of infection, bleeding, pain, scarring, recurrence, bowel injury and nerve injury, and injury to testicle, and chronic pain discussed and he desires to proceed with [facility] repair with mesh.   Lodema Pilot DAVID

## 2011-10-10 NOTE — Anesthesia Procedure Notes (Signed)
Anesthesia Regional Block:  TAP block  Pre-Anesthetic Checklist: ,, timeout performed, Correct Patient, Correct Site, Correct Laterality, Correct Procedure, Correct Position, site marked, Risks and benefits discussed, Surgical consent,  Pre-op evaluation,  Post-op pain management  Laterality: Left  Prep: chloraprep       Needles:  Injection technique: Single-shot  Needle Type: Echogenic Stimulator Needle     Needle Length: 10cm 10 cm Needle Gauge: 21 G    Additional Needles:  Procedures: ultrasound guided TAP block Narrative:  Start time: 10/10/2011 7:19 AM End time: 10/10/2011 7:28 AM Injection made incrementally with aspirations every 5 mL.

## 2011-10-10 NOTE — Preoperative (Signed)
Beta Blockers   Reason not to administer Beta Blockers:Not Applicable 

## 2011-10-10 NOTE — Anesthesia Preprocedure Evaluation (Addendum)
Anesthesia Evaluation  Patient identified by MRN, date of birth, ID band Patient awake    Reviewed: Allergy & Precautions, H&P , NPO status , Patient's Chart, lab work & pertinent test results, reviewed documented beta blocker date and time   Airway Mallampati: II TM Distance: >3 FB Neck ROM: Full    Dental  (+) Edentulous Upper and Dental Advisory Given   Pulmonary  breath sounds clear to auscultation  Pulmonary exam normal       Cardiovascular hypertension, Pt. on medications Rhythm:Regular     Neuro/Psych    GI/Hepatic Neg liver ROS, GERD-  Medicated and Controlled,  Endo/Other  Hypothyroidism   Renal/GU negative Renal ROS     Musculoskeletal   Abdominal   Peds  Hematology   Anesthesia Other Findings   Reproductive/Obstetrics                         Anesthesia Physical Anesthesia Plan  ASA: III  Anesthesia Plan: General   Post-op Pain Management:    Induction: Intravenous  Airway Management Planned: LMA  Additional Equipment:   Intra-op Plan:   Post-operative Plan: Extubation in OR  Informed Consent: I have reviewed the patients History and Physical, chart, labs and discussed the procedure including the risks, benefits and alternatives for the proposed anesthesia with the patient or authorized representative who has indicated his/her understanding and acceptance.   Dental advisory given  Plan Discussed with: CRNA, Anesthesiologist and Surgeon  Anesthesia Plan Comments:        Anesthesia Quick Evaluation

## 2011-10-10 NOTE — Op Note (Signed)
NAME:  KREED, KAUFFMAN NO.:  [id number]  MEDICAL RECORD NO.:  [id number]  LOCATION:  [facility]                         FACILITY:  [facility]  PHYSICIAN:  Lodema Pilot, MD       DATE OF BIRTH:  04-26-1941  DATE OF PROCEDURE:  10/10/2011 DATE OF DISCHARGE:  10/10/2011                              OPERATIVE REPORT   PROCEDURE:  Open repair of left inguinal hernia with mesh.  PREOPERATIVE DIAGNOSIS:  Left inguinal hernia.  POSTOPERATIVE DIAGNOSIS:  Left inguinal hernia.  SURGEON:  Lodema Pilot, MD  ASSISTANT:  None.  ANESTHESIA:  General LMA anesthesia with 30 mL of 1% lidocaine with epinephrine and 0.25% of Marcaine in a 50:50 mixture.  FLUIDS:  1700 mL crystalloid.  ESTIMATED BLOOD LOSS:  Minimal.  DRAINS:  None.  SPECIMENS:  None.  COMPLICATIONS:  None apparent.  FINDINGS:  Very large indirect hernia, smaller direct hernia, but very weak inguinal floor.  Placement of 3 inch x 6 inch UltraPro mesh.  INDICATION FOR PROCEDURE:  Mr. 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  OPERATIVE DETAILS:  Mr. Seydel was seen and evaluated in the preoperative area, and risks and benefits of procedure were again discussed in lay terms.  Informed consent was obtained.  Surgical site was marked prior to anesthetic administration and taken to the operating room, placed on table in supine position, and general LMA anesthesia was obtained.  His scrotum and groin were prepped and draped in standard surgical fashion, and procedure time-out was performed with all operative team members to confirm proper patient, procedure, and an oblique incision was made over the inguinal canal, and dissection was carried down through  the subcutaneous tissue using Bovie electrocautery.  The external oblique fascia was identified and sharply incised along the length of its fibers opening into the external ring.  He had a very large left inguinal hernia.  I was able to reduce this, which facilitated the dissection.  I created space under the external oblique fascia in inguinal canal and dissected around the spermatic cord and hernia sac near the pubic tubercle.  I was able to dissect this circumferentially at the pubic tubercle and placed a Penrose drain for gentle retraction, and the sac was identified and along the anterior portion of the spermatic cord and extended basically down to the scrotum.  The cord was skeletonized from the hernia sac and hernia contents.  He also had a small cord lipoma, which was amputated near its base, but the hernia sac was dissected free from the spermatic cord.  Since it was such a large and thickened hernia sac, decided not to amputate this and it was replaced back in the preperitoneal space, and the internal ring was tightened with interrupted 2-0 Vicryl sutures imbricating the floor over the hernia defect to facilitate mesh placement.  After the cord was inspected, there was no other evidence of any  hernia sac.  The internal ring was tightened medially as well, and the direct space for a weak floor and a small direct hernia as well.  Then, the wound was irrigated with sterile saline solution and was noted to be hemostatic.  Then, a 3 inch x 6 inch UltraPro mesh was tailored to fit the inguinal canal and sutured in place at the pubic tubercle using a 2-0 Prolene suture, and this was run along the shelving edge of the lingual ligament laterally and secured lateral to the internal ring.  A slit was placed in the lateral aspect of the mesh and tail was passed around the cord, and the tails of the mesh were passed around the cord creating a new internal ring.  The tails were tacked  together with 2-0 Prolene sutures, and multiple interrupted 2-0 Prolene sutures were used to suture the mesh to the abdominal wall medially and cephalad and laterally.  Care was taken to avoid placing the sutures in any neurovascular structures.  After the mesh was sutured down, appeared to lie flat and cover both indirect and direct hernia defects.  The wound was again irrigated with sterile saline solution and noted to be hemostatic.  Then, the external oblique fascia was approximated with a 2-0 Vicryl running suture closing the external oblique fascia over the cord, and the wound was injected with total of 30 mL of 1% lidocaine with epinephrine and 0.25% Marcaine in a 50:50 mixture.  The Scarpa's fascia was approximated with a running 3-0 Vicryl suture, and the skin edges were approximated with 4-0 Monocryl subcuticular suture.  Skin was washed and dried and Dermabond was applied.  All sponge, needle, and instrument counts were correct at the end of the case, and the patient tolerated the procedure well without apparent complication.          ______________________________ Lodema Pilot, MD     BL/MEDQ  D:  10/10/2011  T:  10/10/2011  Job:  [id number]

## 2011-10-10 NOTE — Discharge Instructions (Addendum)
Call MD for Temp > 100.4, persistent nausea and vomiting, severe uncontrolled pain, redness, tenderness, or signs of infection Increase activity slowly May shower in 48 hours. NO lifting more than 10 lbs for 4 weeks. Call [phone] for follow up appointment in 2-3 weeks.  Instructions Following General Anesthetic, Adult A nurse specialized in giving anesthesia (anesthetist) or a doctor specialized in giving anesthesia (anesthesiologist) gave you a medicine that made you sleep while a procedure was performed. For as long as 24 hours following this procedure, you may feel:  Dizzy.   Weak.   Drowsy.  AFTER THE PROCEDURE After surgery, you will be taken to the recovery area where a nurse will monitor your progress. You will be allowed to go home when you are awake, stable, taking fluids well, and without complications. For the first 24 hours following an anesthetic:  Have a responsible person with you.   Do not drive a car. If you are alone, do not take public transportation.   Do not drink alcohol.   Do not take medicine that has not been prescribed by your caregiver.   Do not sign important papers or make important decisions.   You may resume normal diet and activities as directed.   Change bandages (dressings) as directed.   Only take over-the-counter or prescription medicines for pain, discomfort, or fever as directed by your caregiver.  If you have questions or problems that seem related to the anesthetic, call the hospital and ask for the anesthetist or anesthesiologist on call. SEEK IMMEDIATE MEDICAL CARE IF:   You develop a rash.   You have difficulty breathing.   You have chest pain.   You develop any allergic problems.  Document Released: 10/28/2000 Document Revised: 07/11/2011 Document Reviewed: 06/08/2007 [facility] Patient Information 2012 [place], [place].

## 2011-10-10 NOTE — Transfer of Care (Signed)
Immediate Anesthesia Transfer of Care Note  Patient: Hunter Key  Procedure(s) Performed: Procedure(s) (LRB): HERNIA REPAIR INGUINAL ADULT (Left) INSERTION OF MESH (N/A)  Patient Location: PACU  Anesthesia Type: General  Level of Consciousness: awake and alert   Airway & Oxygen Therapy: Patient Spontanous Breathing and Patient connected to face mask oxygen  Post-op Assessment: Report given to PACU RN and Post -op Vital signs reviewed and stable  Post vital signs: Reviewed and stable  Complications: No apparent anesthesia complications

## 2011-10-10 NOTE — Progress Notes (Signed)
Pt attempting to void. Pt has met all discharge criteria except being able to urinating. Will continue PO hydration

## 2011-10-11 ENCOUNTER — Telehealth (INDEPENDENT_AMBULATORY_CARE_PROVIDER_SITE_OTHER): Payer: Self-pay

## 2011-10-11 NOTE — Telephone Encounter (Signed)
Patient having painful muscle spasms. Wants to know if something can be prescribed to help.

## 2011-10-21 ENCOUNTER — Encounter (HOSPITAL_COMMUNITY): Payer: Self-pay | Admitting: General Surgery

## 2011-10-31 ENCOUNTER — Encounter (INDEPENDENT_AMBULATORY_CARE_PROVIDER_SITE_OTHER): Payer: Self-pay | Admitting: General Surgery

## 2011-10-31 ENCOUNTER — Ambulatory Visit (INDEPENDENT_AMBULATORY_CARE_PROVIDER_SITE_OTHER): Payer: Medicare HMO | Admitting: General Surgery

## 2011-10-31 VITALS — BP 142/90 | HR 86 | Temp 97.8°F | Resp 18 | Ht 73.0 in | Wt 179.8 lb

## 2011-10-31 DIAGNOSIS — Z4889 Encounter for other specified surgical aftercare: Secondary | ICD-10-CM

## 2011-10-31 DIAGNOSIS — Z5189 Encounter for other specified aftercare: Secondary | ICD-10-CM

## 2011-10-31 NOTE — Progress Notes (Signed)
Subjective:     Patient ID: Hunter Key, male   DOB: 1941-03-16, 71 y.o.   MRN: [id number]  HPI Patient follows up status post open left inguinal hernia repair with mesh on March 7 approximately 3 weeks ago. He says that he had some swelling and bruising in his groin and scrotum immediately postoperatively and some pain for the first week but since then he has not taken any pain pills and the swelling has gone down and he actually feels pretty normal. His bowels have returned to normal and his diet is normal as well. He has actually started to notice a bulge on the right side now. He is not having any discomfort from this.  Review of Systems     Objective:   Physical Exam And his incision is healing well without sign of infection. He has a normal healing ridge but no evidence of recurrent hernia on the left. He has an apparent right inguinal hernia which is reducible and nontender on exam today.    Assessment:     Status post open left inguinal hernia repair with mesh-doing well He seems to be recovering pretty well from his left inguinal hernia and I recommended that he continue with light duty for another week. He appears to have a reducible right inguinal hernia on exam as well but this is asymptomatic and he is unsure if he would like to have this repaired.    Plan:     He seems to be doing well from his left inguinal hernia and I recommended that he continue light duty for another week. At that time he can increase his activity as tolerated. If his right inguinal hernia become symptomatic and he will call back if he would like to have this repaired.

## 2012-10-26 ENCOUNTER — Encounter: Payer: Self-pay | Admitting: Gastroenterology

## 2012-11-30 ENCOUNTER — Ambulatory Visit (AMBULATORY_SURGERY_CENTER): Payer: Medicare Other | Admitting: *Deleted

## 2012-11-30 ENCOUNTER — Encounter: Payer: Self-pay | Admitting: Gastroenterology

## 2012-11-30 VITALS — Ht 73.0 in | Wt 183.0 lb

## 2012-11-30 DIAGNOSIS — Z1211 Encounter for screening for malignant neoplasm of colon: Secondary | ICD-10-CM

## 2012-11-30 MED ORDER — MOVIPREP 100 G PO SOLR: ORAL | Status: DC

## 2012-12-14 ENCOUNTER — Ambulatory Visit (AMBULATORY_SURGERY_CENTER): Payer: Medicare Other | Admitting: Gastroenterology

## 2012-12-14 ENCOUNTER — Encounter: Payer: Self-pay | Admitting: Gastroenterology

## 2012-12-14 VITALS — BP 117/71 | HR 78 | Temp 97.6°F | Resp 14 | Ht 73.0 in | Wt 183.0 lb

## 2012-12-14 DIAGNOSIS — K5730 Diverticulosis of large intestine without perforation or abscess without bleeding: Secondary | ICD-10-CM

## 2012-12-14 DIAGNOSIS — Z860100 Personal history of colon polyps, unspecified: Secondary | ICD-10-CM

## 2012-12-14 DIAGNOSIS — K644 Residual hemorrhoidal skin tags: Secondary | ICD-10-CM

## 2012-12-14 DIAGNOSIS — D126 Benign neoplasm of colon, unspecified: Secondary | ICD-10-CM

## 2012-12-14 DIAGNOSIS — Z1211 Encounter for screening for malignant neoplasm of colon: Secondary | ICD-10-CM

## 2012-12-14 MED ORDER — SODIUM CHLORIDE 0.9 % IV SOLN: 500.0000 mL | INTRAVENOUS | Status: DC

## 2012-12-14 NOTE — Patient Instructions (Addendum)

## 2012-12-14 NOTE — Op Note (Signed)
[facility] [address] [place], [address]   COLONOSCOPY PROCEDURE REPORT  PATIENT: Hunter, Key  MR#: [id number] BIRTHDATE: [birth year] , 71  yrs. old GENDER: Male ENDOSCOPIST: Rachael Fee, MD REFERRED [id number] Tisovec, M.D. PROCEDURE DATE:  12/14/2012 PROCEDURE:   Colonoscopy with snare polypectomy ASA CLASS:   Class II INDICATIONS:colonoscopy Dr.  Kinnie Scales 2003 (pt thinks this was last one he had); found several polyps, pathology mixed HPs and TAs. MEDICATIONS: Fentanyl 50 mcg IV, Versed 5 mg IV, and These medications were titrated to patient response per physician's verbal order  DESCRIPTION OF PROCEDURE:   After the risks benefits and alternatives of the procedure were thoroughly explained, informed consent was obtained.  A digital rectal exam revealed no abnormalities of the rectum.   The LB [id number]-[id number]  endoscope was introduced through the anus and advanced to the cecum, which was identified by both the appendix and ileocecal valve. No adverse events experienced.   The quality of the prep was good.  The instrument was then slowly withdrawn as the colon was fully examined.   COLON FINDINGS: Three polyps were found, removed and sent to pathology.  These were all sessile, 2-66mm across, located in transverse and sigmoid segments, removed with cold snare.  There was left sided diverticulosis.  There were internal and external hemorrhoids.  The examination was otherwise normal.  Retroflexed views revealed no abnormalities. The time to cecum=3 minutes 41 seconds.  Withdrawal time=12 minutes 33 seconds.  The scope was withdrawn and the procedure completed. COMPLICATIONS: There were no complications.  ENDOSCOPIC IMPRESSION: Three polyps were found, removed and sent to pathology. There was left sided diverticulosis. There were internal and external hemorrhoids. The examination was otherwise normal.  RECOMMENDATIONS: If the polyp(s)  removed today are proven to be adenomatous (pre-cancerous) polyps, you will need a colonoscopy in 3 years. You will receive a letter within 1-2 weeks with the results of your biopsy as well as final recommendations.  Please call my office if you have not received a letter after 3 weeks.   eSigned:  Rachael Fee, MD 12/14/2012 9:16 AM

## 2012-12-14 NOTE — Progress Notes (Signed)
Patient did not experience any of the following events: a burn prior to discharge; a fall within the facility; wrong site/side/patient/procedure/implant event; or a hospital transfer or hospital admission upon discharge from the facility. (G8907) Patient did not have preoperative order for IV antibiotic SSI prophylaxis. (G8918)  

## 2012-12-15 ENCOUNTER — Telehealth: Payer: Self-pay | Admitting: *Deleted

## 2012-12-15 NOTE — Telephone Encounter (Signed)
  Follow up Call-  Call back number 12/14/2012  Post procedure Call Back phone  # [phone]  Permission to leave phone message Yes     Patient questions:  Do you have a fever, pain , or abdominal swelling? no Pain Score  0 *  Have you tolerated food without any problems? yes  Have you been able to return to your normal activities? yes  Do you have any questions about your discharge instructions: Diet   no Medications  no Follow up visit  no  Do you have questions or concerns about your Care? no  Actions: * If pain score is 4 or above: No action needed, pain <4.

## 2012-12-18 ENCOUNTER — Encounter: Payer: Self-pay | Admitting: Gastroenterology

## 2013-09-10 ENCOUNTER — Other Ambulatory Visit: Payer: Self-pay | Admitting: Internal Medicine

## 2013-09-10 DIAGNOSIS — M5416 Radiculopathy, lumbar region: Secondary | ICD-10-CM

## 2013-09-16 ENCOUNTER — Ambulatory Visit
Admission: RE | Admit: 2013-09-16 | Discharge: 2013-09-16 | Disposition: A | Discharge status: Home / Self Care | Payer: Medicare HMO | Source: Ambulatory Visit | Attending: Internal Medicine | Admitting: Internal Medicine

## 2013-09-16 DIAGNOSIS — M5416 Radiculopathy, lumbar region: Secondary | ICD-10-CM

## 2014-12-13 ENCOUNTER — Encounter (HOSPITAL_COMMUNITY): Payer: Self-pay | Admitting: Emergency Medicine

## 2014-12-13 ENCOUNTER — Emergency Department (HOSPITAL_COMMUNITY): Payer: Medicare HMO

## 2014-12-13 ENCOUNTER — Observation Stay (HOSPITAL_COMMUNITY)
Admission: EM | Admit: 2014-12-13 | Discharge: 2014-12-14 | Disposition: A | Discharge status: Home / Self Care | Payer: Medicare HMO | Attending: Emergency Medicine | Admitting: Emergency Medicine

## 2014-12-13 ENCOUNTER — Observation Stay (HOSPITAL_COMMUNITY): Payer: Medicare HMO

## 2014-12-13 DIAGNOSIS — I10 Essential (primary) hypertension: Secondary | ICD-10-CM | POA: Diagnosis present

## 2014-12-13 DIAGNOSIS — M1990 Unspecified osteoarthritis, unspecified site: Secondary | ICD-10-CM | POA: Diagnosis not present

## 2014-12-13 DIAGNOSIS — Z79899 Other long term (current) drug therapy: Secondary | ICD-10-CM | POA: Insufficient documentation

## 2014-12-13 DIAGNOSIS — K219 Gastro-esophageal reflux disease without esophagitis: Secondary | ICD-10-CM | POA: Diagnosis present

## 2014-12-13 DIAGNOSIS — E785 Hyperlipidemia, unspecified: Secondary | ICD-10-CM | POA: Insufficient documentation

## 2014-12-13 DIAGNOSIS — R079 Chest pain, unspecified: Secondary | ICD-10-CM | POA: Diagnosis not present

## 2014-12-13 DIAGNOSIS — E039 Hypothyroidism, unspecified: Secondary | ICD-10-CM | POA: Diagnosis not present

## 2014-12-13 DIAGNOSIS — R0789 Other chest pain: Secondary | ICD-10-CM | POA: Diagnosis not present

## 2014-12-13 DIAGNOSIS — K449 Diaphragmatic hernia without obstruction or gangrene: Secondary | ICD-10-CM

## 2014-12-13 DIAGNOSIS — Z87891 Personal history of nicotine dependence: Secondary | ICD-10-CM | POA: Diagnosis not present

## 2014-12-13 DIAGNOSIS — Z9889 Other specified postprocedural states: Secondary | ICD-10-CM | POA: Insufficient documentation

## 2014-12-13 HISTORY — DX: Other chest pain: R07.89

## 2014-12-13 LAB — I-STAT TROPONIN, ED: Troponin i, poc: 0 ng/mL (ref 0.00–0.08)

## 2014-12-13 LAB — CBC
HCT: 44.7 % (ref 39.0–52.0)
Hemoglobin: 15.1 g/dL (ref 13.0–17.0)
MCH: 31.5 pg (ref 26.0–34.0)
MCHC: 33.8 g/dL (ref 30.0–36.0)
MCV: 93.1 fL (ref 78.0–100.0)
PLATELETS: 227 10*3/uL (ref 150–400)
RBC: 4.8 MIL/uL (ref 4.22–5.81)
RDW: 12.8 % (ref 11.5–15.5)
WBC: 3.2 10*3/uL — ABNORMAL LOW (ref 4.0–10.5)

## 2014-12-13 LAB — BASIC METABOLIC PANEL
Anion gap: 10 (ref 5–15)
BUN: 10 mg/dL (ref 6–20)
CO2: 24 mmol/L (ref 22–32)
Calcium: 9.5 mg/dL (ref 8.9–10.3)
Chloride: 104 mmol/L (ref 101–111)
Creatinine, Ser: 1.07 mg/dL (ref 0.61–1.24)
GFR calc Af Amer: >60 mL/min (ref >60)
GLUCOSE: 116 mg/dL — AB (ref 70–99)
Potassium: 3.6 mmol/L (ref 3.5–5.1)
SODIUM: 138 mmol/L (ref 135–145)

## 2014-12-13 LAB — TROPONIN I
Troponin I: <0.03 ng/mL (ref <0.031)
Troponin I: <0.03 ng/mL (ref <0.031)
Troponin I: <0.03 ng/mL (ref <0.031)

## 2014-12-13 MED ORDER — HEPARIN SODIUM (PORCINE) 5000 UNIT/ML IJ SOLN
5000.0000 [IU] | Freq: Three times a day (TID) | INTRAMUSCULAR | Status: DC
Start: 2014-12-13 — End: 2014-12-14
  Administered 2014-12-13 – 2014-12-14 (×3): 5000 [IU] via SUBCUTANEOUS
  Filled 2014-12-13 (×3): qty 1

## 2014-12-13 MED ORDER — ATORVASTATIN CALCIUM 10 MG PO TABS
10.0000 mg | ORAL_TABLET | Freq: Every day | ORAL | Status: DC
  Administered 2014-12-14: 10 mg via ORAL
  Filled 2014-12-13: qty 1

## 2014-12-13 MED ORDER — ASPIRIN EC 325 MG PO TBEC
325.0000 mg | DELAYED_RELEASE_TABLET | Freq: Every day | ORAL | Status: DC
  Administered 2014-12-14: 325 mg via ORAL
  Filled 2014-12-13: qty 1

## 2014-12-13 MED ORDER — IOHEXOL 350 MG/ML SOLN
100.0000 mL | Freq: Once | INTRAVENOUS | Status: AC | PRN
  Administered 2014-12-13: 100 mL via INTRAVENOUS

## 2014-12-13 MED ORDER — NITROGLYCERIN 0.4 MG SL SUBL: 0.4000 mg | SUBLINGUAL_TABLET | SUBLINGUAL | Status: DC | PRN

## 2014-12-13 MED ORDER — LEVOTHYROXINE SODIUM 75 MCG PO TABS
150.0000 ug | ORAL_TABLET | Freq: Every day | ORAL | Status: DC
  Administered 2014-12-14: 150 ug via ORAL
  Filled 2014-12-13: qty 2

## 2014-12-13 MED ORDER — ACETAMINOPHEN 325 MG PO TABS: 650.0000 mg | ORAL_TABLET | ORAL | Status: DC | PRN

## 2014-12-13 MED ORDER — ONDANSETRON HCL 4 MG/2ML IJ SOLN: 4.0000 mg | Freq: Four times a day (QID) | INTRAMUSCULAR | Status: DC | PRN

## 2014-12-13 MED ORDER — QUINAPRIL HCL 10 MG PO TABS
20.0000 mg | ORAL_TABLET | Freq: Every day | ORAL | Status: DC
  Administered 2014-12-14: 20 mg via ORAL
  Filled 2014-12-13: qty 2

## 2014-12-13 MED ORDER — PANTOPRAZOLE SODIUM 40 MG PO TBEC
40.0000 mg | DELAYED_RELEASE_TABLET | Freq: Two times a day (BID) | ORAL | Status: DC
  Administered 2014-12-13 – 2014-12-14 (×2): 40 mg via ORAL
  Filled 2014-12-13 (×2): qty 1

## 2014-12-13 MED ORDER — ASPIRIN 81 MG PO CHEW
324.0000 mg | CHEWABLE_TABLET | Freq: Once | ORAL | Status: AC
  Administered 2014-12-13: 324 mg via ORAL
  Filled 2014-12-13: qty 4

## 2014-12-13 MED ORDER — TAMSULOSIN HCL 0.4 MG PO CAPS: 0.4000 mg | ORAL_CAPSULE | Freq: Every day | ORAL | Status: DC

## 2014-12-13 MED ORDER — GI COCKTAIL ~~LOC~~
30.0000 mL | Freq: Once | ORAL | Status: AC
  Administered 2014-12-13: 30 mL via ORAL
  Filled 2014-12-13: qty 30

## 2014-12-13 NOTE — ED Notes (Signed)
CT called for transport

## 2014-12-13 NOTE — ED Notes (Signed)
Pt off floor to CT

## 2014-12-13 NOTE — Progress Notes (Signed)
  Echocardiogram 2D Echocardiogram has been performed.  Hunter Key FRANCES 12/13/2014, 3:09 PM

## 2014-12-13 NOTE — ED Notes (Signed)
hospitalist at bedside

## 2014-12-13 NOTE — ED Notes (Signed)
Patient with chest discomfort for the last few weeks.  Patient states that it got worse during the night last night and has not been able to sleep.  He states that it feels like he needs to burp.  He denies any shortness of breath, nausea or vomiting.

## 2014-12-13 NOTE — ED Notes (Signed)
Pt returned from CT °

## 2014-12-13 NOTE — ED Provider Notes (Signed)
CSN: [id number]     Arrival date & time 12/13/14  0627 History   First MD Initiated Contact with Patient 12/13/14 [phone]     Chief Complaint  Patient presents with  . Chest Pain     (Consider location/radiation/quality/duration/timing/severity/associated sxs/prior Treatment) HPI  74 year old male presents with chest pressure for the past couple weeks. He states last night since around 2 or 3 AM it has been significantly worse. The pressure is intermittent and seems to be worse after he eats. He has not spirits any exertional symptoms. Denies shortness of breath, nausea, or vomiting. Patient states sometimes it feels like he needs to burp. No radiation of symptoms. He thought it was his hiatal hernia but has been taking his Prilosec as instructed and his symptoms still seem to be coming on. No known coronary disease. Does have a history of hypertension and hyperlipidemia. Denies known history of diabetes. Rates his "pain" as of 4/10.  Past Medical History  Diagnosis Date  . Arthritis   . Hyperlipidemia   . Thyroid disease   . Hypertension     Takes Quinapril  . GERD (gastroesophageal reflux disease)     Takes Prilosec  . Hypothyroidism     Takes Synthroid   Past Surgical History  Procedure Laterality Date  . Cardiac catheterization      Clean cath; unaware of where he had cath done at  . Prostate biopsy    . Biopsy shoulder      Left shoulder biopsy  . Colonoscopy w/ polypectomy    . Inguinal hernia repair  10/10/2011    Procedure: HERNIA REPAIR INGUINAL ADULT;  Surgeon: Madilyn Hook, DO;  Location: [facility];  Service: General;  Laterality: Left;  OPEN LEFT INGUINAL HERNIA REPAIR  . Hernia repair  10/10/11    Open LIH repair w/mesh   Family History  Problem Relation Age of Onset  . Anesthesia problems Neg Hx   . Hypotension Neg Hx   . Malignant hyperthermia Neg Hx   . Pseudochol deficiency Neg Hx   . Colon cancer Neg Hx    History  Substance Use Topics  . Smoking status: Former  Smoker    Quit date: 10/01/1962  . Smokeless tobacco: Former Systems developer    Quit date: 10/01/1962  . Alcohol Use: No    Review of Systems  Constitutional: Negative for diaphoresis.  Respiratory: Negative for cough and shortness of breath.   Cardiovascular: Positive for chest pain. Negative for leg swelling.  Gastrointestinal: Negative for nausea and vomiting.  Musculoskeletal: Negative for myalgias, back pain and neck pain.  All other systems reviewed and are negative.     Allergies  Review of patient's allergies indicates no known allergies.  Home Medications   Prior to Admission medications   Medication Sig Start Date End Date Taking? Authorizing Provider  atorvastatin (LIPITOR) 10 MG tablet Take 10 mg by mouth daily.  08/20/11  Yes Historical Provider, MD  levothyroxine (SYNTHROID, LEVOTHROID) 150 MCG tablet Take 150 mcg by mouth daily before breakfast.   Yes Historical Provider, MD  Multiple Vitamins-Minerals (MULTIVITAMINS THER. W/MINERALS) TABS Take 1 tablet by mouth daily.   Yes Historical Provider, MD  omeprazole (PRILOSEC) 20 MG capsule Take 20 mg by mouth daily.  08/02/11  Yes Historical Provider, MD  quinapril (ACCUPRIL) 20 MG tablet Take 20 mg by mouth daily.  09/03/11  Yes Historical Provider, MD  Tamsulosin HCl (FLOMAX) 0.4 MG CAPS Take 0.4 mg by mouth daily after supper.  10/29/11  Yes  Historical Provider, MD   BP 144/90 mmHg  Pulse 92  Temp(Src) 98.2 F (36.8 C) (Oral)  Resp 20  SpO2 98% Physical Exam  Constitutional: He is oriented to person, place, and time. He appears well-developed and well-nourished.  HENT:  Head: Normocephalic and atraumatic.  Right Ear: External ear normal.  Left Ear: External ear normal.  Nose: Nose normal.  Eyes: Right eye exhibits no discharge. Left eye exhibits no discharge.  Neck: Neck supple.  Cardiovascular: Normal rate, regular rhythm, normal heart sounds and intact distal pulses.   Pulses:      Radial pulses are 2+ on the right  side, and 2+ on the left side.       Dorsalis pedis pulses are 2+ on the right side, and 2+ on the left side.  Pulmonary/Chest: Effort normal and breath sounds normal. He has no wheezes.  Abdominal: Soft. He exhibits no distension. There is no tenderness.  Musculoskeletal: He exhibits no edema.  Neurological: He is alert and oriented to person, place, and time.  Skin: Skin is warm and dry.  Nursing note and vitals reviewed.   ED Course  Procedures (including critical care time) Labs Review Labs Reviewed  CBC - Abnormal; Notable for the following:    WBC 3.2 (*)    All other components within normal limits  BASIC METABOLIC PANEL - Abnormal; Notable for the following:    Glucose, Bld 116 (*)    All other components within normal limits  Randolm Idol, ED    Imaging Review Dg Chest Port 1 View  12/13/2014   CLINICAL DATA:  74 year old male with hypertensive chest pain. Initial encounter.  EXAM: PORTABLE CHEST - 1 VIEW  COMPARISON:  06/20/2007.  FINDINGS: No infiltrate, congestive heart failure or pneumothorax.  Tortuous aorta. This has progressed since prior exam (partially explained by mild rotation). If primary aortic abnormality is of high clinical concern then CT imaging may be considered.  Heart size within normal limits.  Bilateral acromioclavicular joint degenerative changes.  IMPRESSION: Tortuous aorta.  Please see above discussion.  No evidence of infiltrate, congestive heart failure or pneumothorax.   Electronically Signed   By: Genia Del M.D.   On: 12/13/2014 07:10   Ct Angio Chest Aorta W/cm &/or Wo/cm  12/13/2014   CLINICAL DATA:  Chest pressure and pain for a few weeks, increased pain last night, history hypertension, hyperlipidemia, former smoker  EXAM: CT ANGIOGRAPHY CHEST, ABDOMEN AND PELVIS  TECHNIQUE: Multidetector CT imaging through the chest, abdomen and pelvis was performed using the standard protocol during bolus administration of intravenous contrast.  Multiplanar reconstructed images and MIPs were obtained and reviewed to evaluate the vascular anatomy.  CONTRAST:  112mL OMNIPAQUE IOHEXOL 350 MG/ML SOLN IV ; oral contrast not administered for this indication.  COMPARISON:  None  FINDINGS: CTA CHEST FINDINGS  Mild scattered atherosclerotic calcifications aorta.  Moderate-sized hiatal hernia.  No aortic aneurysm or dissection.  Pulmonary arteries well opacified and patent.  No evidence of pulmonary embolism.  No thoracic adenopathy.  Lungs clear.  No pulmonary infiltrate, pleural effusion, pneumothorax, or mass/nodule.  Osseous structures unremarkable.  Review of the MIP images confirms the above findings.  CTA ABDOMEN AND PELVIS FINDINGS  Minimal scattered atherosclerotic calcification aorta without aortic aneurysm or dissection.  Liver, spleen, gallbladder, pancreas, kidneys, and adrenal glands normal.  Significantly enlarged prostate gland 6.1 x 5.3 x 5.6 cm with mild trabeculation of the bladder wall and multiple small bladder diverticula noted likely representing chronic outlet obstruction.  RIGHT inguinal hernia containing fat.  Minimal sigmoid diverticulosis.  Remainder of stomach and bowel loops unremarkable.  Normal appendix.  No mass, adenopathy, free fluid or free air.  Osseous demineralization with superior endplate compression fracture of L1 vertebral body, unchanged since 2006.  Review of the MIP images confirms the above findings.  IMPRESSION: No evidence of aortic dissection or aneurysm.  No evidence of pulmonary embolism.  Moderate-sized hiatal hernia.  Significantly enlarged prostate gland with chronic bladder outlet obstruction.  RIGHT inguinal hernia containing fat.  Minimal sigmoid diverticulosis.   Electronically Signed   By: Lavonia Dana M.D.   On: 12/13/2014 08:57   Ct Angio Abd/pel W/ And/or W/o  12/13/2014   CLINICAL DATA:  Chest pressure and pain for a few weeks, increased pain last night, history hypertension, hyperlipidemia, former  smoker  EXAM: CT ANGIOGRAPHY CHEST, ABDOMEN AND PELVIS  TECHNIQUE: Multidetector CT imaging through the chest, abdomen and pelvis was performed using the standard protocol during bolus administration of intravenous contrast. Multiplanar reconstructed images and MIPs were obtained and reviewed to evaluate the vascular anatomy.  CONTRAST:  125mL OMNIPAQUE IOHEXOL 350 MG/ML SOLN IV ; oral contrast not administered for this indication.  COMPARISON:  None  FINDINGS: CTA CHEST FINDINGS  Mild scattered atherosclerotic calcifications aorta.  Moderate-sized hiatal hernia.  No aortic aneurysm or dissection.  Pulmonary arteries well opacified and patent.  No evidence of pulmonary embolism.  No thoracic adenopathy.  Lungs clear.  No pulmonary infiltrate, pleural effusion, pneumothorax, or mass/nodule.  Osseous structures unremarkable.  Review of the MIP images confirms the above findings.  CTA ABDOMEN AND PELVIS FINDINGS  Minimal scattered atherosclerotic calcification aorta without aortic aneurysm or dissection.  Liver, spleen, gallbladder, pancreas, kidneys, and adrenal glands normal.  Significantly enlarged prostate gland 6.1 x 5.3 x 5.6 cm with mild trabeculation of the bladder wall and multiple small bladder diverticula noted likely representing chronic outlet obstruction.  RIGHT inguinal hernia containing fat.  Minimal sigmoid diverticulosis.  Remainder of stomach and bowel loops unremarkable.  Normal appendix.  No mass, adenopathy, free fluid or free air.  Osseous demineralization with superior endplate compression fracture of L1 vertebral body, unchanged since 2006.  Review of the MIP images confirms the above findings.  IMPRESSION: No evidence of aortic dissection or aneurysm.  No evidence of pulmonary embolism.  Moderate-sized hiatal hernia.  Significantly enlarged prostate gland with chronic bladder outlet obstruction.  RIGHT inguinal hernia containing fat.  Minimal sigmoid diverticulosis.   Electronically Signed    By: Lavonia Dana M.D.   On: 12/13/2014 08:57     EKG Interpretation   Date/Time:  Tuesday Dec 13 2014 06:33:32 EDT Ventricular Rate:  97 PR Interval:  141 QRS Duration: 144 QT Interval:  365 QTC Calculation: 464 R Axis:     Text Interpretation:  Sinus tachycardia Multiple ventricular premature  complexes Right bundle branch block Baseline wander in lead(s) V3 RBBB new  compared to 2006 Confirmed by Deveron Shamoon  MD, [place] ([id number]) on 12/13/2014  6:59:37 AM      MDM   Final diagnoses:  Chest pressure    Patient with atypical chest pain worsening over last 1-2 weeks. More likely GERD but with risk factors (age, HTN, HLD, family history) his HEART score recommends observation admission. Pain free at this time. Low risk history for dissection but given CXR findings a CT was obtained and is unremarkable. Admit to hospitalist.    Sherwood Gambler, MD 12/13/14 1004

## 2014-12-13 NOTE — H&P (Signed)
[facility] History and Physical                                                                                    Hunter Key, is a 74 y.o. male  MRN: [id number]   DOB - 1941-03-09  Admit Date - 12/13/2014  Outpatient Primary MD for the patient is Haywood Pao, MD  With History of -  Past Medical History  Diagnosis Date  . Arthritis   . Hyperlipidemia   . Thyroid disease   . Hypertension     Takes Quinapril  . GERD (gastroesophageal reflux disease)     Takes Prilosec  . Hypothyroidism     Takes Synthroid      Past Surgical History  Procedure Laterality Date  . Cardiac catheterization      Clean cath; unaware of where he had cath done at  . Prostate biopsy    . Biopsy shoulder      Left shoulder biopsy  . Colonoscopy w/ polypectomy    . Inguinal hernia repair  10/10/2011    Procedure: HERNIA REPAIR INGUINAL ADULT;  Surgeon: Madilyn Hook, DO;  Location: [facility];  Service: General;  Laterality: Left;  OPEN LEFT INGUINAL HERNIA REPAIR  . Hernia repair  10/10/11    Open LIH repair w/mesh    in for   Chief Complaint  Patient presents with  . Chest Pain     HPI  Hunter Key  is a 74 y.o. male with pmh of hypertension, hyperlipidemia and GERD presents to ED with 2 week history of intermittent, midsternal chest pressure. Pt states 'discomfort' has been ongoing for approximately 2 weeks. He does not notice discomfort when around and moving, but more when at rest. Pain is nonradiating, denies any sob, DOE, diaphoresis, recent fever, abdominal pain, n/v/d. States he occasionally feels palpitations, but this has been ongoing for years. He was seen for similar complaints by PCP last week and instructed to continue PPI and use TUMS as needed, which did not relief his discomfort. Symptoms became worse last evening prompting visit to the ED.    ED workup thus far has included CT angio to rule out aortic dissection, which was negative. He did receive a GI cocktail that may have  helped his discomfort, but still not completely gone. EKG does show a RBBB however, uncertain if this is new as there are no recent EKGs available for comparison. Will admit patient for observation to rule out ACS.  Review of Systems   In addition to the HPI above,  No Fever-chills, No Headache, No changes with Vision or hearing, No problems swallowing food or Liquids, No Cough or Shortness of Breath, No Abdominal pain, No Nausea or Vomiting, Bowel movements are regular, No Blood in stool or Urine, No dysuria, No new skin rashes or bruises, No new joints pains-aches,  No new weakness, tingling, numbness in any extremity, No recent weight gain or loss, A full 10 point Review of Systems was done, except as stated above, all other Review of Systems were negative.  Social History History  Substance Use Topics  . Smoking status: Former Smoker    Quit date: 10/01/1962  .  Smokeless tobacco: Former Systems developer    Quit date: 10/01/1962  . Alcohol Use: No    Family History Family History  Problem Relation Age of Onset  . Anesthesia problems Neg Hx   . Hypotension Neg Hx   . Malignant hyperthermia Neg Hx   . Pseudochol deficiency Neg Hx   . Colon cancer Neg Hx   Pt does have a brother with CAD s/p CABG  Prior to Admission medications   Medication Sig Start Date End Date Taking? Authorizing Provider  atorvastatin (LIPITOR) 10 MG tablet Take 10 mg by mouth daily.  08/20/11  Yes Historical Provider, MD  levothyroxine (SYNTHROID, LEVOTHROID) 150 MCG tablet Take 150 mcg by mouth daily before breakfast.   Yes Historical Provider, MD  Multiple Vitamins-Minerals (MULTIVITAMINS THER. W/MINERALS) TABS Take 1 tablet by mouth daily.   Yes Historical Provider, MD  omeprazole (PRILOSEC) 20 MG capsule Take 20 mg by mouth daily.  08/02/11  Yes Historical Provider, MD  quinapril (ACCUPRIL) 20 MG tablet Take 20 mg by mouth daily.  09/03/11  Yes Historical Provider, MD  Tamsulosin HCl (FLOMAX) 0.4 MG CAPS Take  0.4 mg by mouth daily after supper.  10/29/11  Yes Historical Provider, MD    No Known Allergies  Physical Exam  Vitals  Blood pressure 149/92, pulse 92, temperature 98.2 F (36.8 C), temperature source Oral, resp. rate 21, SpO2 97 %.   General:  lying in bed in NAD,   Psych:  Normal affect and insight, Not Suicidal or Homicidal, Awake Alert, Oriented X 3.  Neuro:   No F.N deficits, ALL C.Nerves Intact, Strength 5/5 all 4 extremities, Sensation intact all 4 extremities.  ENT:  Ears and Eyes appear Normal, Conjunctivae clear, PER. Moist oral mucosa without erythema or exudates.  Neck:  Supple, No lymphadenopathy appreciated  Respiratory:  Symmetrical chest wall movement, Good air movement bilaterally, CTAB.  Cardiac:  RRR, No Murmurs, no LE edema noted, no JVD.    Abdomen:  Positive bowel sounds, Soft, Non tender, Non distended,  No masses appreciated  Skin:  No Cyanosis, Normal Skin Turgor, No Skin Rash or Bruise.  Extremities:  Able to move all 4. 5/5 strength in each,  no effusions.  Data Review  CBC  Recent Labs Lab 12/13/14 0645  WBC 3.2*  HGB 15.1  HCT 44.7  PLT 227  MCV 93.1  MCH 31.5  MCHC 33.8  RDW 12.8    Chemistries   Recent Labs Lab 12/13/14 0645  NA 138  K 3.6  CL 104  CO2 24  GLUCOSE 116*  BUN 10  CREATININE 1.07  CALCIUM 9.5    CrCl cannot be calculated (Unknown ideal weight.).  No results for input(s): TSH, T4TOTAL, T3FREE, THYROIDAB in the last 72 hours.  Invalid input(s): FREET3  Coagulation profile No results for input(s): INR, PROTIME in the last 168 hours.  No results for input(s): DDIMER in the last 72 hours.  Cardiac Enzymes No results for input(s): CKMB, TROPONINI, MYOGLOBIN in the last 168 hours.  Invalid input(s): CK  Invalid input(s): POCBNP  Urinalysis No results found for: COLORURINE, APPEARANCEUR, LABSPEC, PHURINE, GLUCOSEU, HGBUR, BILIRUBINUR, KETONESUR, PROTEINUR, UROBILINOGEN, NITRITE,  LEUKOCYTESUR  Imaging results:   Dg Chest Port 1 View  12/13/2014   CLINICAL DATA:  74 year old male with hypertensive chest pain. Initial encounter.  EXAM: PORTABLE CHEST - 1 VIEW  COMPARISON:  06/20/2007.  FINDINGS: No infiltrate, congestive heart failure or pneumothorax.  Tortuous aorta. This has progressed since prior exam (partially explained  by mild rotation). If primary aortic abnormality is of high clinical concern then CT imaging may be considered.  Heart size within normal limits.  Bilateral acromioclavicular joint degenerative changes.  IMPRESSION: Tortuous aorta.  Please see above discussion.  No evidence of infiltrate, congestive heart failure or pneumothorax.   Electronically Signed   By: Genia Del M.D.   On: 12/13/2014 07:10   Ct Angio Chest Aorta W/cm &/or Wo/cm  12/13/2014   CLINICAL DATA:  Chest pressure and pain for a few weeks, increased pain last night, history hypertension, hyperlipidemia, former smoker  EXAM: CT ANGIOGRAPHY CHEST, ABDOMEN AND PELVIS  TECHNIQUE: Multidetector CT imaging through the chest, abdomen and pelvis was performed using the standard protocol during bolus administration of intravenous contrast. Multiplanar reconstructed images and MIPs were obtained and reviewed to evaluate the vascular anatomy.  CONTRAST:  145mL OMNIPAQUE IOHEXOL 350 MG/ML SOLN IV ; oral contrast not administered for this indication.  COMPARISON:  None  FINDINGS: CTA CHEST FINDINGS  Mild scattered atherosclerotic calcifications aorta.  Moderate-sized hiatal hernia.  No aortic aneurysm or dissection.  Pulmonary arteries well opacified and patent.  No evidence of pulmonary embolism.  No thoracic adenopathy.  Lungs clear.  No pulmonary infiltrate, pleural effusion, pneumothorax, or mass/nodule.  Osseous structures unremarkable.  Review of the MIP images confirms the above findings.  CTA ABDOMEN AND PELVIS FINDINGS  Minimal scattered atherosclerotic calcification aorta without aortic aneurysm or  dissection.  Liver, spleen, gallbladder, pancreas, kidneys, and adrenal glands normal.  Significantly enlarged prostate gland 6.1 x 5.3 x 5.6 cm with mild trabeculation of the bladder wall and multiple small bladder diverticula noted likely representing chronic outlet obstruction.  RIGHT inguinal hernia containing fat.  Minimal sigmoid diverticulosis.  Remainder of stomach and bowel loops unremarkable.  Normal appendix.  No mass, adenopathy, free fluid or free air.  Osseous demineralization with superior endplate compression fracture of L1 vertebral body, unchanged since 2006.  Review of the MIP images confirms the above findings.  IMPRESSION: No evidence of aortic dissection or aneurysm.  No evidence of pulmonary embolism.  Moderate-sized hiatal hernia.  Significantly enlarged prostate gland with chronic bladder outlet obstruction.  RIGHT inguinal hernia containing fat.  Minimal sigmoid diverticulosis.   Electronically Signed   By: Lavonia Dana M.D.   On: 12/13/2014 08:57   Ct Angio Abd/pel W/ And/or W/o  12/13/2014   CLINICAL DATA:  Chest pressure and pain for a few weeks, increased pain last night, history hypertension, hyperlipidemia, former smoker  EXAM: CT ANGIOGRAPHY CHEST, ABDOMEN AND PELVIS  TECHNIQUE: Multidetector CT imaging through the chest, abdomen and pelvis was performed using the standard protocol during bolus administration of intravenous contrast. Multiplanar reconstructed images and MIPs were obtained and reviewed to evaluate the vascular anatomy.  CONTRAST:  185mL OMNIPAQUE IOHEXOL 350 MG/ML SOLN IV ; oral contrast not administered for this indication.  COMPARISON:  None  FINDINGS: CTA CHEST FINDINGS  Mild scattered atherosclerotic calcifications aorta.  Moderate-sized hiatal hernia.  No aortic aneurysm or dissection.  Pulmonary arteries well opacified and patent.  No evidence of pulmonary embolism.  No thoracic adenopathy.  Lungs clear.  No pulmonary infiltrate, pleural effusion,  pneumothorax, or mass/nodule.  Osseous structures unremarkable.  Review of the MIP images confirms the above findings.  CTA ABDOMEN AND PELVIS FINDINGS  Minimal scattered atherosclerotic calcification aorta without aortic aneurysm or dissection.  Liver, spleen, gallbladder, pancreas, kidneys, and adrenal glands normal.  Significantly enlarged prostate gland 6.1 x 5.3 x 5.6 cm with mild  trabeculation of the bladder wall and multiple small bladder diverticula noted likely representing chronic outlet obstruction.  RIGHT inguinal hernia containing fat.  Minimal sigmoid diverticulosis.  Remainder of stomach and bowel loops unremarkable.  Normal appendix.  No mass, adenopathy, free fluid or free air.  Osseous demineralization with superior endplate compression fracture of L1 vertebral body, unchanged since 2006.  Review of the MIP images confirms the above findings.  IMPRESSION: No evidence of aortic dissection or aneurysm.  No evidence of pulmonary embolism.  Moderate-sized hiatal hernia.  Significantly enlarged prostate gland with chronic bladder outlet obstruction.  RIGHT inguinal hernia containing fat.  Minimal sigmoid diverticulosis.   Electronically Signed   By: Lavonia Dana M.D.   On: 12/13/2014 08:57    My personal review of EKG: NSR with frequent PVCs, RBBB   Assessment & Plan  Active Problems:   Essential hypertension   GERD   Hiatal hernia   Chest pressure  Chest pressure -Given multiple risk factors and EKG findings, will admit overnight for further evaluation -cycle cardiac enzymes, 2Decho r/o wall motion abnormalities/assess LV fx, asa -If workup unremarkable, pt can follow up outpt.   GERD, hiatal hernia -will increase PPI to BID  Hypertension -reasonable control right now -continue home ACEI. Monitor  Hx hyperlipidemia -continue home statin -lipid profile in am    DVT Prophylaxis SQ heparin  Family Communication: wife at bedside on exam    Code Status:  Full  code  Condition:  stable  Time spent in minutes : [place], NP on 12/13/2014 at 10:14 AM Between 7am to 7pm - Pager - [phone] After 7pm go to [email or web address] - password [email or web address]  And look for the night coverage person covering me after hours  [facility]

## 2014-12-14 DIAGNOSIS — E785 Hyperlipidemia, unspecified: Secondary | ICD-10-CM | POA: Diagnosis not present

## 2014-12-14 DIAGNOSIS — R0789 Other chest pain: Secondary | ICD-10-CM | POA: Diagnosis not present

## 2014-12-14 DIAGNOSIS — R079 Chest pain, unspecified: Secondary | ICD-10-CM

## 2014-12-14 DIAGNOSIS — K449 Diaphragmatic hernia without obstruction or gangrene: Secondary | ICD-10-CM

## 2014-12-14 DIAGNOSIS — E039 Hypothyroidism, unspecified: Secondary | ICD-10-CM | POA: Diagnosis not present

## 2014-12-14 DIAGNOSIS — M1990 Unspecified osteoarthritis, unspecified site: Secondary | ICD-10-CM | POA: Diagnosis not present

## 2014-12-14 DIAGNOSIS — K219 Gastro-esophageal reflux disease without esophagitis: Secondary | ICD-10-CM | POA: Diagnosis not present

## 2014-12-14 DIAGNOSIS — I10 Essential (primary) hypertension: Secondary | ICD-10-CM | POA: Diagnosis not present

## 2014-12-14 LAB — LIPID PANEL
Cholesterol: 132 mg/dL (ref 0–200)
HDL: 50 mg/dL (ref >40)
LDL Cholesterol: 64 mg/dL (ref 0–99)
Total CHOL/HDL Ratio: 2.6 RATIO
Triglycerides: 88 mg/dL (ref <150)
VLDL: 18 mg/dL (ref 0–40)

## 2014-12-14 MED ORDER — OMEPRAZOLE 20 MG PO CPDR: 20.0000 mg | DELAYED_RELEASE_CAPSULE | Freq: Two times a day (BID) | ORAL | Status: AC

## 2014-12-14 NOTE — Discharge Summary (Signed)
Physician Discharge Summary  Hunter Key [id number] DOB: [birth year] DOA: 12/13/2014  PCP: Haywood Pao, MD  Admit date: 12/13/2014 Discharge date: 12/14/2014  Time spent: 40 minutes  Recommendations for Outpatient Follow-up:  1. Follow-up with primary care physician within one week. 2. Omeprazole switched to 20 mg by mouth twice a day  Discharge Diagnoses:  Active Problems:   Essential hypertension   GERD   Hiatal hernia   Chest pressure   Chest pain   Discharge Condition: Stable  Diet recommendation: Heart healthy  Filed Weights   12/13/14 1134 12/14/14 0500  Weight: 78.608 kg (173 lb 4.8 oz) 77.701 kg (171 lb 4.8 oz)    History of present illness:  Hunter Key is a 74 y.o. male with pmh of hypertension, hyperlipidemia and GERD presents to ED with 2 week history of intermittent, midsternal chest pressure. Pt states 'discomfort' has been ongoing for approximately 2 weeks. He does not notice discomfort when around and moving, but more when at rest. Pain is nonradiating, denies any sob, DOE, diaphoresis, recent fever, abdominal pain, n/v/d. States he occasionally feels palpitations, but this has been ongoing for years. He was seen for similar complaints by PCP last week and instructed to continue PPI and use TUMS as needed, which did not relief his discomfort. Symptoms became worse last evening prompting visit to the ED.  ED workup thus far has included CT angio to rule out aortic dissection, which was negative. He did receive a GI cocktail that may have helped his discomfort, but still not completely gone. EKG does show a RBBB however, uncertain if this is new as there are no recent EKGs available for comparison. Will admit patient for observation to rule out ACS.   Hospital Course:   Chest pain Presented with atypical chest pain. Pain is substernal, pressure to burning in quality and being going on for the past 2 days. ACS ruled out by -3 sets of cardiac enzymes  and 12-lead EKG. Also had negative CT angiography for PE or aortic dissection. Patient mentioned that GI cocktail helped him the most (have history of hiatal hernia with GERD symptoms). EKG showed RBBB, unclear if it's new or not. This is felt to be secondary to his GERD symptoms, no further cardiac workup.  GERD, hiatal hernia. Patient is on omeprazole 20 mg by mouth daily, mentioned feeling of pressure build up substernally. His symptoms improved after GI cocktail. PPI increased to 20 mg twice a day, explained to him to reduce his acidic food intake as well.  Hypertension/dyslipidemia Blood pressure controlled recently, home medications continued. Dyslipidemia appears to be controlled, total cholesterol is 132, TGL 88, HDL 50 and LDL 64. Continue current medications.   Procedures:  None  Consultations:  None  Discharge Exam: Filed Vitals:   12/14/14 0815  BP: 143/90  Pulse: 76  Temp: 98.7 F (37.1 C)  Resp: 18   General: Alert and awake, oriented x3, not in any acute distress. HEENT: anicteric sclera, pupils reactive to light and accommodation, EOMI CVS: S1-S2 clear, no murmur rubs or gallops Chest: clear to auscultation bilaterally, no wheezing, rales or rhonchi Abdomen: soft nontender, nondistended, normal bowel sounds, no organomegaly Extremities: no cyanosis, clubbing or edema noted bilaterally Neuro: Cranial nerves II-XII intact, no focal neurological deficits  Discharge Instructions   Discharge Instructions    Diet - low sodium heart healthy    Complete by:  As directed      Increase activity slowly    Complete by:  As  directed           Current Discharge Medication List    CONTINUE these medications which have CHANGED   Details  omeprazole (PRILOSEC) 20 MG capsule Take 1 capsule (20 mg total) by mouth 2 (two) times daily before a meal. Qty: 60 capsule, Refills: 0      CONTINUE these medications which have NOT CHANGED   Details  atorvastatin  (LIPITOR) 10 MG tablet Take 10 mg by mouth daily.     levothyroxine (SYNTHROID, LEVOTHROID) 150 MCG tablet Take 150 mcg by mouth daily before breakfast.    Multiple Vitamins-Minerals (MULTIVITAMINS THER. W/MINERALS) TABS Take 1 tablet by mouth daily.    quinapril (ACCUPRIL) 20 MG tablet Take 20 mg by mouth daily.     Tamsulosin HCl (FLOMAX) 0.4 MG CAPS Take 0.4 mg by mouth daily after supper.        No Known Allergies Follow-up Information    Follow up with Haywood Pao, MD In 1 week.   Specialty:  Internal Medicine   Contact information:   [place] [address] [phone]        The results of significant diagnostics from this hospitalization (including imaging, microbiology, ancillary and laboratory) are listed below for reference.    Significant Diagnostic Studies: Dg Chest Port 1 View  12/13/2014   CLINICAL DATA:  74 year old male with hypertensive chest pain. Initial encounter.  EXAM: PORTABLE CHEST - 1 VIEW  COMPARISON:  06/20/2007.  FINDINGS: No infiltrate, congestive heart failure or pneumothorax.  Tortuous aorta. This has progressed since prior exam (partially explained by mild rotation). If primary aortic abnormality is of high clinical concern then CT imaging may be considered.  Heart size within normal limits.  Bilateral acromioclavicular joint degenerative changes.  IMPRESSION: Tortuous aorta.  Please see above discussion.  No evidence of infiltrate, congestive heart failure or pneumothorax.   Electronically Signed   By: Genia Del M.D.   On: 12/13/2014 07:10   Ct Angio Chest Aorta W/cm &/or Wo/cm  12/13/2014   CLINICAL DATA:  Chest pressure and pain for a few weeks, increased pain last night, history hypertension, hyperlipidemia, former smoker  EXAM: CT ANGIOGRAPHY CHEST, ABDOMEN AND PELVIS  TECHNIQUE: Multidetector CT imaging through the chest, abdomen and pelvis was performed using the standard protocol during bolus administration of  intravenous contrast. Multiplanar reconstructed images and MIPs were obtained and reviewed to evaluate the vascular anatomy.  CONTRAST:  176mL OMNIPAQUE IOHEXOL 350 MG/ML SOLN IV ; oral contrast not administered for this indication.  COMPARISON:  None  FINDINGS: CTA CHEST FINDINGS  Mild scattered atherosclerotic calcifications aorta.  Moderate-sized hiatal hernia.  No aortic aneurysm or dissection.  Pulmonary arteries well opacified and patent.  No evidence of pulmonary embolism.  No thoracic adenopathy.  Lungs clear.  No pulmonary infiltrate, pleural effusion, pneumothorax, or mass/nodule.  Osseous structures unremarkable.  Review of the MIP images confirms the above findings.  CTA ABDOMEN AND PELVIS FINDINGS  Minimal scattered atherosclerotic calcification aorta without aortic aneurysm or dissection.  Liver, spleen, gallbladder, pancreas, kidneys, and adrenal glands normal.  Significantly enlarged prostate gland 6.1 x 5.3 x 5.6 cm with mild trabeculation of the bladder wall and multiple small bladder diverticula noted likely representing chronic outlet obstruction.  RIGHT inguinal hernia containing fat.  Minimal sigmoid diverticulosis.  Remainder of stomach and bowel loops unremarkable.  Normal appendix.  No mass, adenopathy, free fluid or free air.  Osseous demineralization with superior endplate compression fracture of L1 vertebral body,  unchanged since 2006.  Review of the MIP images confirms the above findings.  IMPRESSION: No evidence of aortic dissection or aneurysm.  No evidence of pulmonary embolism.  Moderate-sized hiatal hernia.  Significantly enlarged prostate gland with chronic bladder outlet obstruction.  RIGHT inguinal hernia containing fat.  Minimal sigmoid diverticulosis.   Electronically Signed   By: Lavonia Dana M.D.   On: 12/13/2014 08:57   Ct Angio Abd/pel W/ And/or W/o  12/13/2014   CLINICAL DATA:  Chest pressure and pain for a few weeks, increased pain last night, history hypertension,  hyperlipidemia, former smoker  EXAM: CT ANGIOGRAPHY CHEST, ABDOMEN AND PELVIS  TECHNIQUE: Multidetector CT imaging through the chest, abdomen and pelvis was performed using the standard protocol during bolus administration of intravenous contrast. Multiplanar reconstructed images and MIPs were obtained and reviewed to evaluate the vascular anatomy.  CONTRAST:  132mL OMNIPAQUE IOHEXOL 350 MG/ML SOLN IV ; oral contrast not administered for this indication.  COMPARISON:  None  FINDINGS: CTA CHEST FINDINGS  Mild scattered atherosclerotic calcifications aorta.  Moderate-sized hiatal hernia.  No aortic aneurysm or dissection.  Pulmonary arteries well opacified and patent.  No evidence of pulmonary embolism.  No thoracic adenopathy.  Lungs clear.  No pulmonary infiltrate, pleural effusion, pneumothorax, or mass/nodule.  Osseous structures unremarkable.  Review of the MIP images confirms the above findings.  CTA ABDOMEN AND PELVIS FINDINGS  Minimal scattered atherosclerotic calcification aorta without aortic aneurysm or dissection.  Liver, spleen, gallbladder, pancreas, kidneys, and adrenal glands normal.  Significantly enlarged prostate gland 6.1 x 5.3 x 5.6 cm with mild trabeculation of the bladder wall and multiple small bladder diverticula noted likely representing chronic outlet obstruction.  RIGHT inguinal hernia containing fat.  Minimal sigmoid diverticulosis.  Remainder of stomach and bowel loops unremarkable.  Normal appendix.  No mass, adenopathy, free fluid or free air.  Osseous demineralization with superior endplate compression fracture of L1 vertebral body, unchanged since 2006.  Review of the MIP images confirms the above findings.  IMPRESSION: No evidence of aortic dissection or aneurysm.  No evidence of pulmonary embolism.  Moderate-sized hiatal hernia.  Significantly enlarged prostate gland with chronic bladder outlet obstruction.  RIGHT inguinal hernia containing fat.  Minimal sigmoid diverticulosis.    Electronically Signed   By: Lavonia Dana M.D.   On: 12/13/2014 08:57    Microbiology: No results found for this or any previous visit (from the past 240 hour(s)).   Labs: Basic Metabolic Panel:  Recent Labs Lab 12/13/14 0645  NA 138  K 3.6  CL 104  CO2 24  GLUCOSE 116*  BUN 10  CREATININE 1.07  CALCIUM 9.5   Liver Function Tests: No results for input(s): AST, ALT, ALKPHOS, BILITOT, PROT, ALBUMIN in the last 168 hours. No results for input(s): LIPASE, AMYLASE in the last 168 hours. No results for input(s): AMMONIA in the last 168 hours. CBC:  Recent Labs Lab 12/13/14 0645  WBC 3.2*  HGB 15.1  HCT 44.7  MCV 93.1  PLT 227   Cardiac Enzymes:  Recent Labs Lab 12/13/14 1200 12/13/14 1512 12/13/14 1900  TROPONINI <0.03 <0.03 <0.03   BNP: BNP (last 3 results) No results for input(s): BNP in the last 8760 hours.  ProBNP (last 3 results) No results for input(s): PROBNP in the last 8760 hours.  CBG: No results for input(s): GLUCAP in the last 168 hours.     Signed:  Kealani Leckey A  [facility] 12/14/2014, 11:54 AM

## 2015-01-12 ENCOUNTER — Encounter (HOSPITAL_COMMUNITY): Payer: Self-pay | Admitting: *Deleted

## 2015-01-12 ENCOUNTER — Emergency Department (HOSPITAL_COMMUNITY)
Admission: EM | Admit: 2015-01-12 | Discharge: 2015-01-12 | Disposition: A | Discharge status: Home / Self Care | Payer: Medicare HMO | Attending: Emergency Medicine | Admitting: Emergency Medicine

## 2015-01-12 DIAGNOSIS — I10 Essential (primary) hypertension: Secondary | ICD-10-CM | POA: Insufficient documentation

## 2015-01-12 DIAGNOSIS — E785 Hyperlipidemia, unspecified: Secondary | ICD-10-CM | POA: Insufficient documentation

## 2015-01-12 DIAGNOSIS — N401 Enlarged prostate with lower urinary tract symptoms: Secondary | ICD-10-CM | POA: Diagnosis not present

## 2015-01-12 DIAGNOSIS — R339 Retention of urine, unspecified: Secondary | ICD-10-CM | POA: Diagnosis present

## 2015-01-12 DIAGNOSIS — E039 Hypothyroidism, unspecified: Secondary | ICD-10-CM | POA: Insufficient documentation

## 2015-01-12 DIAGNOSIS — Z79899 Other long term (current) drug therapy: Secondary | ICD-10-CM | POA: Diagnosis not present

## 2015-01-12 DIAGNOSIS — Z87891 Personal history of nicotine dependence: Secondary | ICD-10-CM | POA: Diagnosis not present

## 2015-01-12 DIAGNOSIS — M1990 Unspecified osteoarthritis, unspecified site: Secondary | ICD-10-CM | POA: Insufficient documentation

## 2015-01-12 DIAGNOSIS — N390 Urinary tract infection, site not specified: Secondary | ICD-10-CM

## 2015-01-12 DIAGNOSIS — K219 Gastro-esophageal reflux disease without esophagitis: Secondary | ICD-10-CM | POA: Diagnosis not present

## 2015-01-12 HISTORY — DX: Benign prostatic hyperplasia without lower urinary tract symptoms: N40.0

## 2015-01-12 LAB — URINALYSIS, ROUTINE W REFLEX MICROSCOPIC
BILIRUBIN URINE: NEGATIVE
Glucose, UA: NEGATIVE mg/dL
Ketones, ur: NEGATIVE mg/dL
Nitrite: NEGATIVE
Protein, ur: 100 mg/dL — AB
Specific Gravity, Urine: 1.007 (ref 1.005–1.030)
Urobilinogen, UA: 0.2 mg/dL (ref 0.0–1.0)
pH: 5.5 (ref 5.0–8.0)

## 2015-01-12 LAB — BASIC METABOLIC PANEL
Anion gap: 8 (ref 5–15)
BUN: 5 mg/dL — ABNORMAL LOW (ref 6–20)
CALCIUM: 9.2 mg/dL (ref 8.9–10.3)
CO2: 24 mmol/L (ref 22–32)
Chloride: 104 mmol/L (ref 101–111)
Creatinine, Ser: 0.96 mg/dL (ref 0.61–1.24)
GLUCOSE: 103 mg/dL — AB (ref 65–99)
POTASSIUM: 3.6 mmol/L (ref 3.5–5.1)
SODIUM: 136 mmol/L (ref 135–145)

## 2015-01-12 LAB — URINE MICROSCOPIC-ADD ON

## 2015-01-12 LAB — CBC
HCT: 40.9 % (ref 39.0–52.0)
HEMOGLOBIN: 14 g/dL (ref 13.0–17.0)
MCH: 31.7 pg (ref 26.0–34.0)
MCHC: 34.2 g/dL (ref 30.0–36.0)
MCV: 92.5 fL (ref 78.0–100.0)
Platelets: 244 10*3/uL (ref 150–400)
RBC: 4.42 MIL/uL (ref 4.22–5.81)
RDW: 12.5 % (ref 11.5–15.5)
WBC: 7.3 10*3/uL (ref 4.0–10.5)

## 2015-01-12 MED ORDER — CIPROFLOXACIN HCL 250 MG PO TABS: 250.0000 mg | ORAL_TABLET | Freq: Two times a day (BID) | ORAL | Status: DC

## 2015-01-12 NOTE — ED Notes (Signed)
Drainage bag removed and leg bag attached using aseptic technique. D/C instructions reviewed with patient and instructions involving how to maintain the drainage bag, such as emptying it, were reviewed with by teach back method. Pt and pt wife verbalize and show understanding of instructions. Total of 700 cc of clear yellow urine was drained during this visit.

## 2015-01-12 NOTE — ED Notes (Addendum)
Bladder scanned at this time, >475 cc shown on screen. Pt was able to void approximately 30 min prior and voided 150 cc of yellow urine.

## 2015-01-12 NOTE — Discharge Instructions (Signed)
Return to the ED with any concerns including decreased flow of urine, abdominal pain, fever/chills, vomiting and not able to keep down liquids, decreased level of alertness/lethargy, or any other alarming symptoms

## 2015-01-12 NOTE — ED Provider Notes (Signed)
CSN: [id number]     Arrival date & time 01/12/15  1216 History   First MD Initiated Contact with Patient 01/12/15 1228     Chief Complaint  Patient presents with  . Urinary Retention     (Consider location/radiation/quality/duration/timing/severity/associated sxs/prior Treatment) HPI  Pt presenting with c/o urinary retention.  He states that over the past couple of days he has been having difficulty passing his urine.  He is able to urinate, but urine comes slowly and he feels that he is not fully emptying his bladder.  He talked with his doctor who recommended increasing his flomax dose.  Denies abdominal pain or pressure.  No fever/chills.  No burning with urination.  There are no other associated systemic symptoms, there are no other alleviating or modifying factors.   Past Medical History  Diagnosis Date  . Arthritis   . Hyperlipidemia   . Thyroid disease   . Hypertension     Takes Quinapril  . GERD (gastroesophageal reflux disease)     Takes Prilosec  . Hypothyroidism     Takes Synthroid  . Chest pressure 12/2014  . Enlarged prostate    Past Surgical History  Procedure Laterality Date  . Cardiac catheterization      Clean cath; unaware of where he had cath done at  . Prostate biopsy    . Biopsy shoulder      Left shoulder biopsy  . Colonoscopy w/ polypectomy    . Inguinal hernia repair  10/10/2011    Procedure: HERNIA REPAIR INGUINAL ADULT;  Surgeon: Madilyn Hook, DO;  Location: [facility];  Service: General;  Laterality: Left;  OPEN LEFT INGUINAL HERNIA REPAIR  . Hernia repair  10/10/11    Open LIH repair w/mesh   Family History  Problem Relation Age of Onset  . Anesthesia problems Neg Hx   . Hypotension Neg Hx   . Malignant hyperthermia Neg Hx   . Pseudochol deficiency Neg Hx   . Colon cancer Neg Hx    History  Substance Use Topics  . Smoking status: Former Smoker    Quit date: 10/01/1962  . Smokeless tobacco: Former Systems developer    Quit date: 10/01/1962  . Alcohol Use: Yes      Comment: occ    Review of Systems  ROS reviewed and all otherwise negative except for mentioned in HPI    Allergies  Review of patient's allergies indicates no known allergies.  Home Medications   Prior to Admission medications   Medication Sig Start Date End Date Taking? Authorizing Provider  atorvastatin (LIPITOR) 10 MG tablet Take 10 mg by mouth daily.  08/20/11  Yes Historical Provider, MD  levothyroxine (SYNTHROID, LEVOTHROID) 150 MCG tablet Take 150 mcg by mouth daily before breakfast.   Yes Historical Provider, MD  Multiple Vitamins-Minerals (MULTIVITAMINS THER. W/MINERALS) TABS Take 1 tablet by mouth daily.   Yes Historical Provider, MD  omeprazole (PRILOSEC) 20 MG capsule Take 1 capsule (20 mg total) by mouth 2 (two) times daily before a meal. 12/14/14  Yes Verlee Monte, MD  quinapril (ACCUPRIL) 20 MG tablet Take 20 mg by mouth daily.  09/03/11  Yes Historical Provider, MD  Tamsulosin HCl (FLOMAX) 0.4 MG CAPS Take 0.4 mg by mouth 2 (two) times daily.  10/29/11  Yes Historical Provider, MD  ciprofloxacin (CIPRO) 250 MG tablet Take 1 tablet (250 mg total) by mouth every 12 (twelve) hours. 01/12/15   Alfonzo Beers, MD   BP 155/97 mmHg  Pulse 80  Temp(Src) 98 F (  36.7 C) (Oral)  Resp 16  Ht 6\' 1"  (1.854 m)  Wt 175 lb (79.379 kg)  BMI 23.09 kg/m2  SpO2 97%  Vitals reviewed Physical Exam  Physical Examination: General appearance - alert, well appearing, and in no distress Mental status - alert, oriented to person, place, and time Eyes - no conjunctival injection, no scleral icterus Chest - clear to auscultation, no wheezes, rales or rhonchi, symmetric air entry Heart - normal rate, regular rhythm, normal S1, S2, no murmurs, rubs, clicks or gallops Abdomen - soft, nontender, nondistended, no masses or organomegaly GU Male - foley placed with drainage of clear yellow urine Neurological - alert, oriented x 3 Extremities - peripheral pulses normal, no pedal edema, no clubbing  or cyanosis Skin - normal coloration and turgor, no rashes  ED Course  Procedures (including critical care time) Labs Review Labs Reviewed  URINALYSIS, ROUTINE W REFLEX MICROSCOPIC (NOT AT [facility]) - Abnormal; Notable for the following:    APPearance TURBID (*)    Hgb urine dipstick MODERATE (*)    Protein, ur 100 (*)    Leukocytes, UA LARGE (*)    All other components within normal limits  BASIC METABOLIC PANEL - Abnormal; Notable for the following:    Glucose, Bld 103 (*)    BUN 5 (*)    All other components within normal limits  URINE MICROSCOPIC-ADD ON - Abnormal; Notable for the following:    Bacteria, UA FEW (*)    All other components within normal limits  URINE CULTURE  CBC    Imaging Review No results found.   EKG Interpretation None      MDM   Final diagnoses:  Urinary retention  UTI (lower urinary tract infection)    Pt presenting with decreased urination and urinary retention.  He continues to pass some urine in small amounts.  Bladder scan shows > 475cc urine.  Foley placed without difficulty.  Urine c/w UTI- started on cipro and urine culture sent.  Renal function preserved.  Pt to f/u with urology.  Discharged with strict return precautions.  Pt agreeable with plan.    Alfonzo Beers, MD 01/13/15 [phone]

## 2015-01-12 NOTE — ED Notes (Signed)
Pt states difficulty urinating since Tuesday.  States he called his pcp who told him to double his flomax dose, but the pt states only minimal improvement.

## 2015-01-12 NOTE — ED Notes (Signed)
Pt tolerated foley catheter well. 675 cc yellow, clear urine in total was released. Pt reports relief.

## 2015-01-14 LAB — URINE CULTURE: Colony Count: 85000

## 2015-01-16 ENCOUNTER — Telehealth (HOSPITAL_COMMUNITY): Payer: Self-pay

## 2015-01-16 NOTE — Telephone Encounter (Signed)
Post ED Visit - Positive Culture Follow-up  Culture report reviewed by antimicrobial stewardship pharmacist: []  Wes Dulaney, Pharm.D., BCPS []  Heide Guile, Pharm.D., BCPS []  Alycia Rossetti, Pharm.D., BCPS []  [place], Pharm.D., BCPS, AAHIVP []  Legrand Como, Pharm.D., BCPS, AAHIVP []  Isac Sarna, Pharm.D., BCPS X  Tegan Magsam, Pharm D  Positive Urine culture, 85,000 colonies -> Staph species Treated with Ciprofloxacin, organism sensitive to the same and no further patient follow-up is required at this time.  Colter, [place] 01/16/2015, 4:28 AM

## 2015-05-08 DIAGNOSIS — E039 Hypothyroidism, unspecified: Secondary | ICD-10-CM | POA: Diagnosis not present

## 2015-05-08 DIAGNOSIS — I10 Essential (primary) hypertension: Secondary | ICD-10-CM | POA: Diagnosis not present

## 2015-05-08 DIAGNOSIS — E785 Hyperlipidemia, unspecified: Secondary | ICD-10-CM | POA: Diagnosis not present

## 2015-05-08 DIAGNOSIS — Z0000 Encounter for general adult medical examination without abnormal findings: Secondary | ICD-10-CM | POA: Diagnosis not present

## 2015-05-15 DIAGNOSIS — Z0000 Encounter for general adult medical examination without abnormal findings: Secondary | ICD-10-CM | POA: Diagnosis not present

## 2015-05-15 DIAGNOSIS — M5416 Radiculopathy, lumbar region: Secondary | ICD-10-CM | POA: Diagnosis not present

## 2015-05-15 DIAGNOSIS — K449 Diaphragmatic hernia without obstruction or gangrene: Secondary | ICD-10-CM | POA: Diagnosis not present

## 2015-05-15 DIAGNOSIS — I4510 Unspecified right bundle-branch block: Secondary | ICD-10-CM | POA: Diagnosis not present

## 2015-05-15 DIAGNOSIS — I493 Ventricular premature depolarization: Secondary | ICD-10-CM | POA: Diagnosis not present

## 2015-05-15 DIAGNOSIS — D692 Other nonthrombocytopenic purpura: Secondary | ICD-10-CM | POA: Diagnosis not present

## 2015-05-15 DIAGNOSIS — R69 Illness, unspecified: Secondary | ICD-10-CM | POA: Diagnosis not present

## 2015-05-15 DIAGNOSIS — Z6824 Body mass index (BMI) 24.0-24.9, adult: Secondary | ICD-10-CM | POA: Diagnosis not present

## 2015-05-15 DIAGNOSIS — D126 Benign neoplasm of colon, unspecified: Secondary | ICD-10-CM | POA: Diagnosis not present

## 2015-05-15 DIAGNOSIS — E039 Hypothyroidism, unspecified: Secondary | ICD-10-CM | POA: Diagnosis not present

## 2015-05-31 IMAGING — CT CT ANGIO CHEST
2 of 9 series · 17 of 46 positions shown · IV contrast (omnipaque)
Comparison: None

CLINICAL DATA: Chest pressure and pain for a few weeks, increased
pain last night, history hypertension, hyperlipidemia, former smoker

EXAM:
CT ANGIOGRAPHY CHEST, ABDOMEN AND PELVIS
TECHNIQUE: Multidetector CT imaging through the chest, abdomen and pelvis was
performed using the standard protocol during bolus administration of
intravenous contrast. Multiplanar reconstructed images and MIPs were
obtained and reviewed to evaluate the vascular anatomy.
CONTRAST:  100mL OMNIPAQUE IOHEXOL 350 MG/ML SOLN IV ; oral contrast
not administered for this indication.

[Series 5: dissection 2.0 i30f 1 · axial · 0.79mm/px · z∈[-411,+139]mm · 14 of 313 slices shown]
[im 19/313  lung]
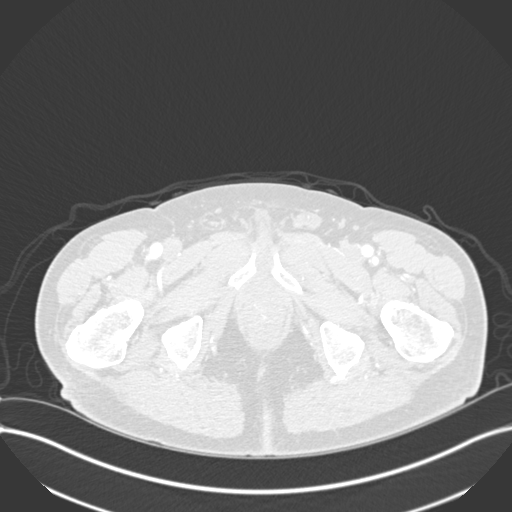
[im 37/313  soft-tissue]
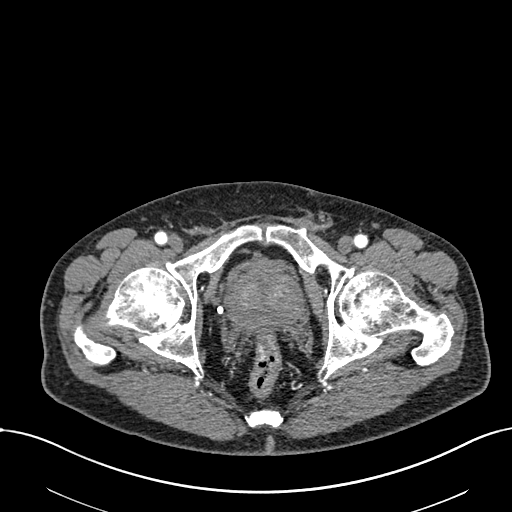
[im 56/313  lung]
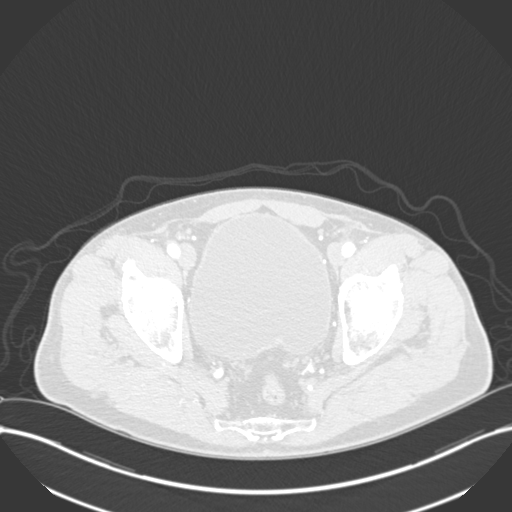
[im 92/313  soft-tissue]
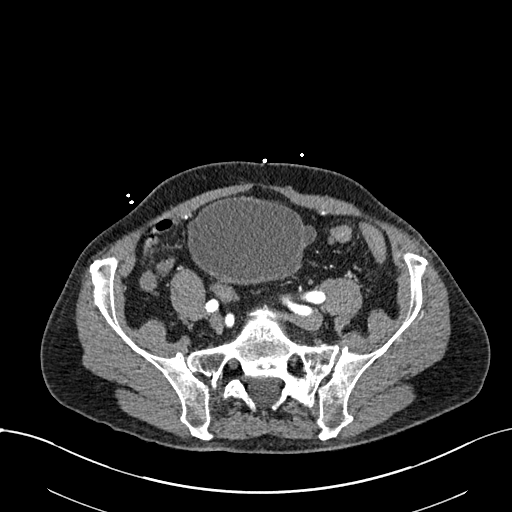
[im 111/313  lung]
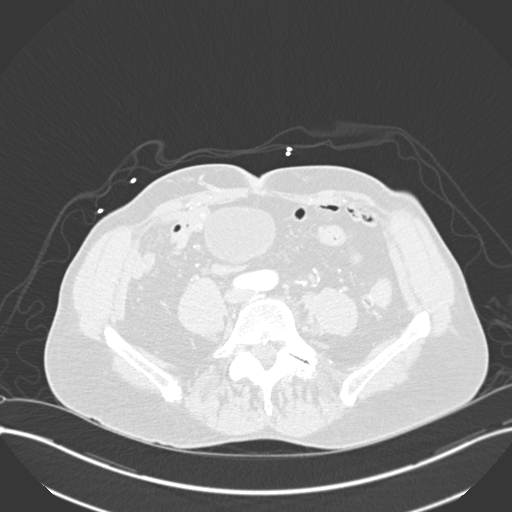
[im 129/313  soft-tissue]
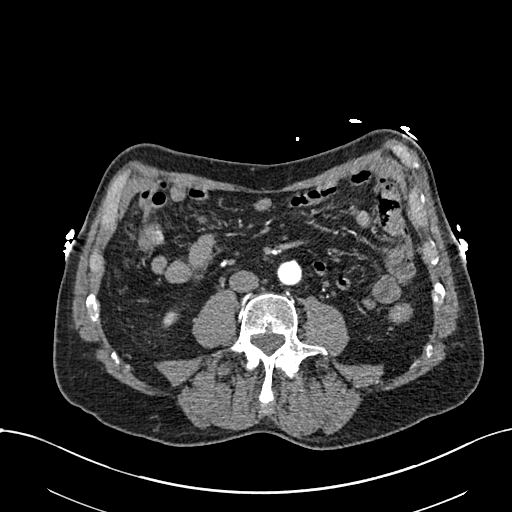
[im 147/313  lung]
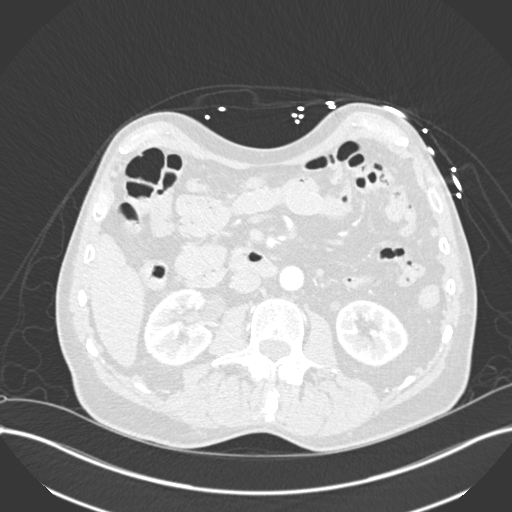
[im 166/313  soft-tissue]
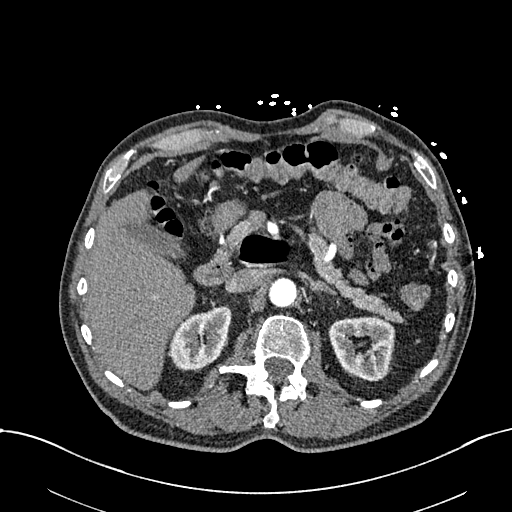
[im 184/313  lung]
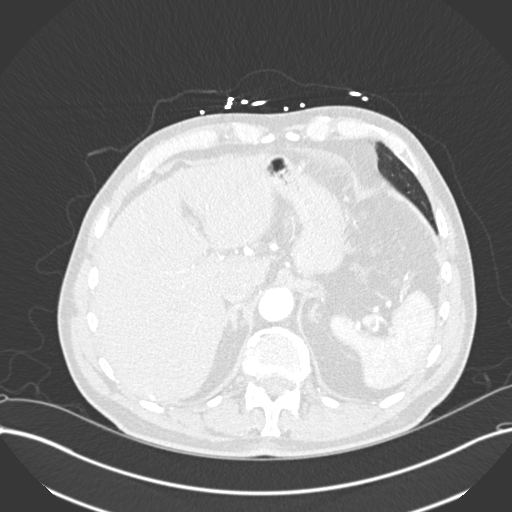
[im 202/313  soft-tissue]
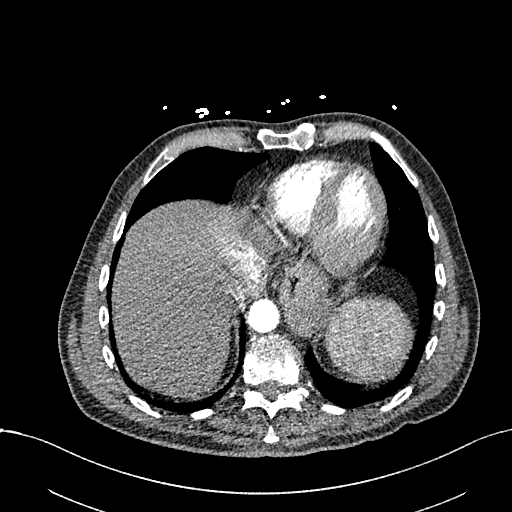
[im 239/313  lung]
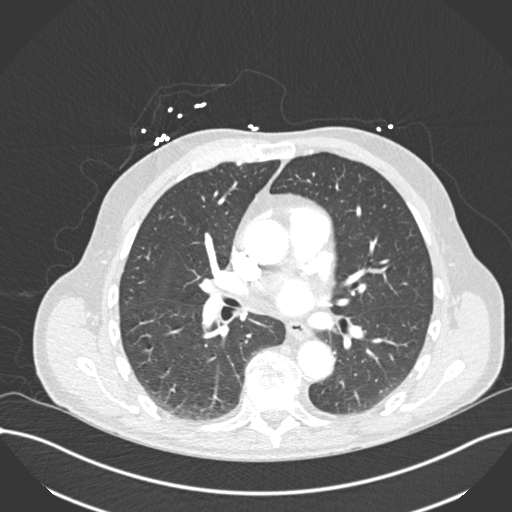
[im 257/313  soft-tissue]
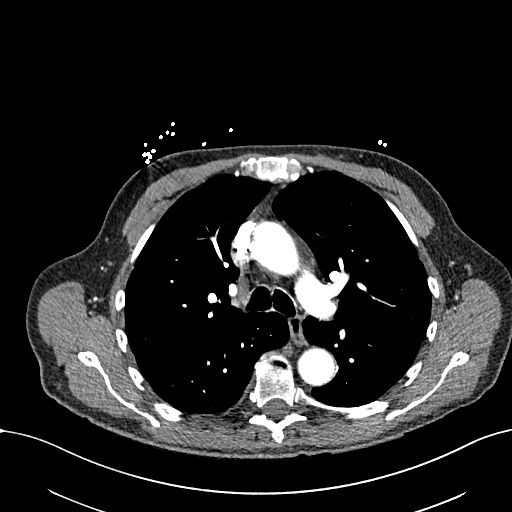
[im 276/313  lung]
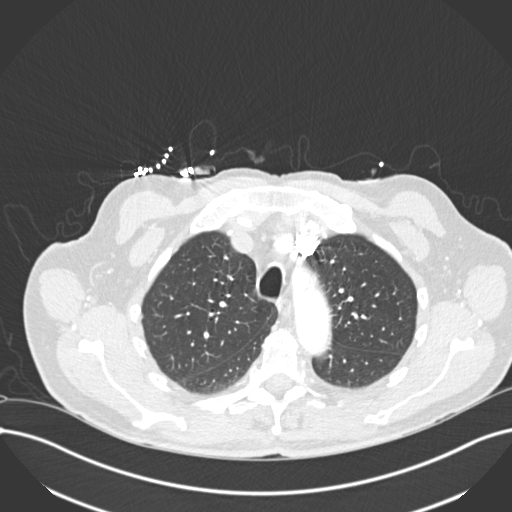
[im 294/313  soft-tissue]
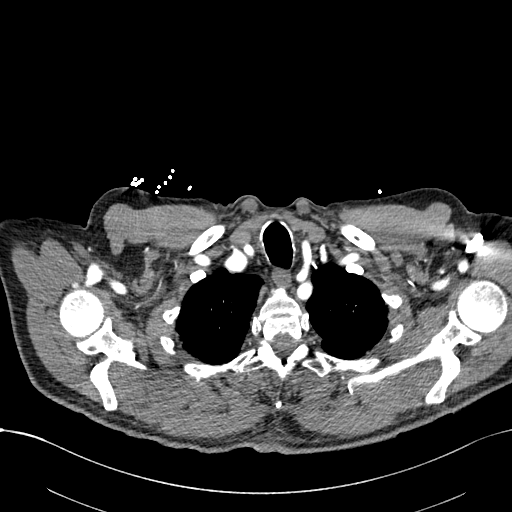

[Series 7: coronal mpr · coronal · 0.75mm/px · 3 of 126 slices shown]
[im 32/126  soft-tissue]
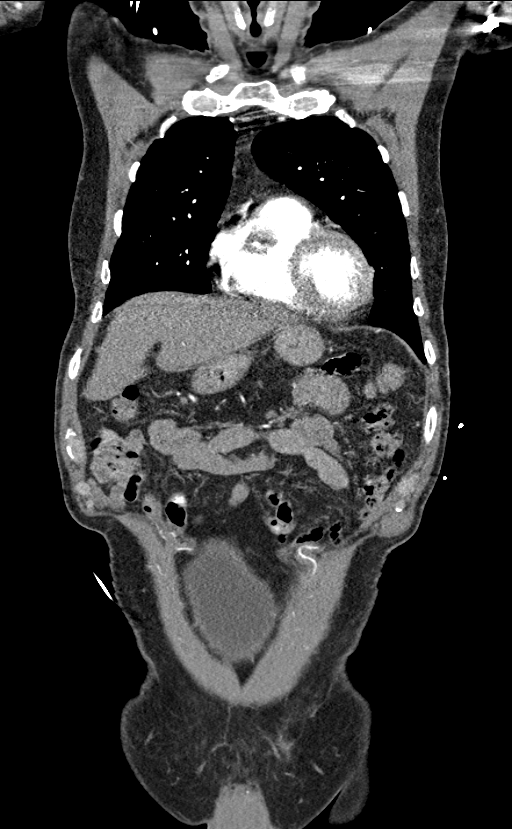
[im 63/126  soft-tissue]
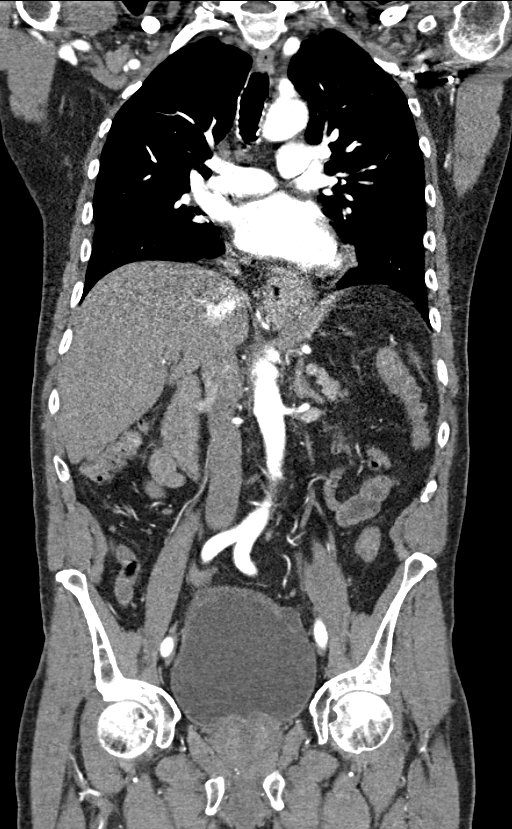
[im 94/126  soft-tissue]
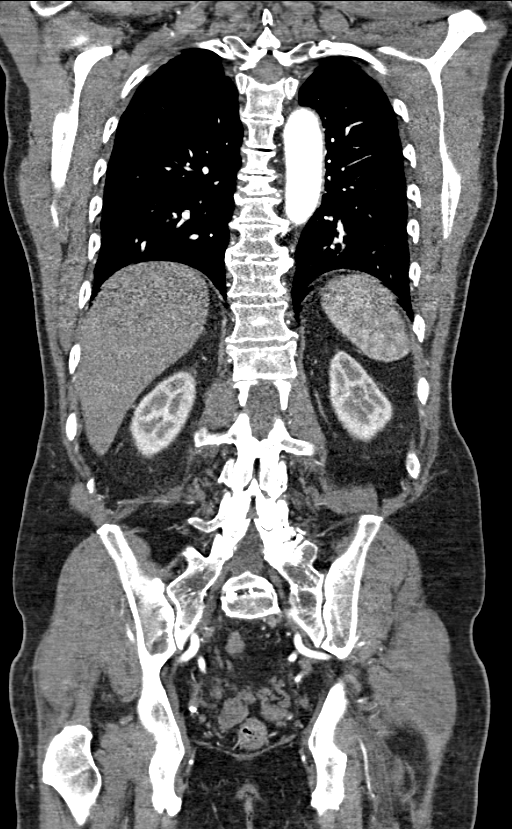

[17 of 46 positions shown; findings below may reference images not displayed]

FINDINGS: CTA CHEST FINDINGS

Mild scattered atherosclerotic calcifications aorta.

Moderate-sized hiatal hernia.

No aortic aneurysm or dissection.

Pulmonary arteries well opacified and patent.

No evidence of pulmonary embolism.

No thoracic adenopathy.

Lungs clear.

No pulmonary infiltrate, pleural effusion, pneumothorax, or
mass/nodule.

Osseous structures unremarkable.

Review of the MIP images confirms the above findings.

CTA ABDOMEN AND PELVIS FINDINGS

Minimal scattered atherosclerotic calcification aorta without aortic
aneurysm or dissection.

Liver, spleen, gallbladder, pancreas, kidneys, and adrenal glands
normal.

Significantly enlarged prostate gland 6.1 x 5.3 x 5.6 cm with mild
trabeculation of the bladder wall and multiple small bladder
diverticula noted likely representing chronic outlet obstruction.

RIGHT inguinal hernia containing fat.

Minimal sigmoid diverticulosis.

Remainder of stomach and bowel loops unremarkable.

Normal appendix.

No mass, adenopathy, free fluid or free air.

Osseous demineralization with superior endplate compression fracture
of L1 vertebral body, unchanged since 7999.

Review of the MIP images confirms the above findings.
IMPRESSION: No evidence of aortic dissection or aneurysm.

No evidence of pulmonary embolism.

Moderate-sized hiatal hernia.

Significantly enlarged prostate gland with chronic bladder outlet
obstruction.

RIGHT inguinal hernia containing fat.

Minimal sigmoid diverticulosis.

## 2015-11-21 DIAGNOSIS — Z6825 Body mass index (BMI) 25.0-25.9, adult: Secondary | ICD-10-CM | POA: Diagnosis not present

## 2015-11-21 DIAGNOSIS — E7800 Pure hypercholesterolemia, unspecified: Secondary | ICD-10-CM | POA: Diagnosis not present

## 2015-11-21 DIAGNOSIS — D126 Benign neoplasm of colon, unspecified: Secondary | ICD-10-CM | POA: Diagnosis not present

## 2015-11-21 DIAGNOSIS — M5416 Radiculopathy, lumbar region: Secondary | ICD-10-CM | POA: Diagnosis not present

## 2015-11-21 DIAGNOSIS — R69 Illness, unspecified: Secondary | ICD-10-CM | POA: Diagnosis not present

## 2015-11-21 DIAGNOSIS — D692 Other nonthrombocytopenic purpura: Secondary | ICD-10-CM | POA: Diagnosis not present

## 2015-11-21 DIAGNOSIS — I10 Essential (primary) hypertension: Secondary | ICD-10-CM | POA: Diagnosis not present

## 2015-11-21 DIAGNOSIS — N401 Enlarged prostate with lower urinary tract symptoms: Secondary | ICD-10-CM | POA: Diagnosis not present

## 2015-11-21 DIAGNOSIS — M170 Bilateral primary osteoarthritis of knee: Secondary | ICD-10-CM | POA: Diagnosis not present

## 2015-11-21 DIAGNOSIS — E038 Other specified hypothyroidism: Secondary | ICD-10-CM | POA: Diagnosis not present

## 2015-11-22 ENCOUNTER — Encounter: Payer: Self-pay | Admitting: Gastroenterology

## 2016-02-26 DIAGNOSIS — E038 Other specified hypothyroidism: Secondary | ICD-10-CM | POA: Diagnosis not present

## 2016-02-26 DIAGNOSIS — R600 Localized edema: Secondary | ICD-10-CM | POA: Diagnosis not present

## 2016-02-26 DIAGNOSIS — I872 Venous insufficiency (chronic) (peripheral): Secondary | ICD-10-CM | POA: Diagnosis not present

## 2016-02-26 DIAGNOSIS — Z6825 Body mass index (BMI) 25.0-25.9, adult: Secondary | ICD-10-CM | POA: Diagnosis not present

## 2016-02-26 DIAGNOSIS — I10 Essential (primary) hypertension: Secondary | ICD-10-CM | POA: Diagnosis not present

## 2016-05-14 DIAGNOSIS — E038 Other specified hypothyroidism: Secondary | ICD-10-CM | POA: Diagnosis not present

## 2016-05-14 DIAGNOSIS — E7800 Pure hypercholesterolemia, unspecified: Secondary | ICD-10-CM | POA: Diagnosis not present

## 2016-05-14 DIAGNOSIS — I10 Essential (primary) hypertension: Secondary | ICD-10-CM | POA: Diagnosis not present

## 2016-05-14 DIAGNOSIS — Z125 Encounter for screening for malignant neoplasm of prostate: Secondary | ICD-10-CM | POA: Diagnosis not present

## 2016-05-21 DIAGNOSIS — D126 Benign neoplasm of colon, unspecified: Secondary | ICD-10-CM | POA: Diagnosis not present

## 2016-05-21 DIAGNOSIS — D692 Other nonthrombocytopenic purpura: Secondary | ICD-10-CM | POA: Diagnosis not present

## 2016-05-21 DIAGNOSIS — N401 Enlarged prostate with lower urinary tract symptoms: Secondary | ICD-10-CM | POA: Diagnosis not present

## 2016-05-21 DIAGNOSIS — I872 Venous insufficiency (chronic) (peripheral): Secondary | ICD-10-CM | POA: Diagnosis not present

## 2016-05-21 DIAGNOSIS — M170 Bilateral primary osteoarthritis of knee: Secondary | ICD-10-CM | POA: Diagnosis not present

## 2016-05-21 DIAGNOSIS — Z6824 Body mass index (BMI) 24.0-24.9, adult: Secondary | ICD-10-CM | POA: Diagnosis not present

## 2016-05-21 DIAGNOSIS — I10 Essential (primary) hypertension: Secondary | ICD-10-CM | POA: Diagnosis not present

## 2016-05-21 DIAGNOSIS — Z0000 Encounter for general adult medical examination without abnormal findings: Secondary | ICD-10-CM | POA: Diagnosis not present

## 2016-05-21 DIAGNOSIS — E038 Other specified hypothyroidism: Secondary | ICD-10-CM | POA: Diagnosis not present

## 2016-05-21 DIAGNOSIS — K449 Diaphragmatic hernia without obstruction or gangrene: Secondary | ICD-10-CM | POA: Diagnosis not present

## 2016-06-06 DIAGNOSIS — M1711 Unilateral primary osteoarthritis, right knee: Secondary | ICD-10-CM | POA: Diagnosis not present

## 2016-06-06 DIAGNOSIS — M1712 Unilateral primary osteoarthritis, left knee: Secondary | ICD-10-CM | POA: Diagnosis not present

## 2016-06-21 DIAGNOSIS — M1712 Unilateral primary osteoarthritis, left knee: Secondary | ICD-10-CM | POA: Diagnosis not present

## 2016-06-21 DIAGNOSIS — M1711 Unilateral primary osteoarthritis, right knee: Secondary | ICD-10-CM | POA: Diagnosis not present

## 2016-06-21 DIAGNOSIS — M170 Bilateral primary osteoarthritis of knee: Secondary | ICD-10-CM | POA: Diagnosis not present

## 2016-07-01 DIAGNOSIS — M1711 Unilateral primary osteoarthritis, right knee: Secondary | ICD-10-CM | POA: Diagnosis not present

## 2016-07-01 DIAGNOSIS — M170 Bilateral primary osteoarthritis of knee: Secondary | ICD-10-CM | POA: Diagnosis not present

## 2016-07-01 DIAGNOSIS — M1712 Unilateral primary osteoarthritis, left knee: Secondary | ICD-10-CM | POA: Diagnosis not present

## 2016-07-03 ENCOUNTER — Encounter: Payer: Self-pay | Admitting: Internal Medicine

## 2016-07-08 DIAGNOSIS — M170 Bilateral primary osteoarthritis of knee: Secondary | ICD-10-CM | POA: Diagnosis not present

## 2016-07-08 DIAGNOSIS — M1711 Unilateral primary osteoarthritis, right knee: Secondary | ICD-10-CM | POA: Diagnosis not present

## 2016-07-08 DIAGNOSIS — M1712 Unilateral primary osteoarthritis, left knee: Secondary | ICD-10-CM | POA: Diagnosis not present

## 2016-11-27 DIAGNOSIS — N401 Enlarged prostate with lower urinary tract symptoms: Secondary | ICD-10-CM | POA: Diagnosis not present

## 2016-11-27 DIAGNOSIS — I10 Essential (primary) hypertension: Secondary | ICD-10-CM | POA: Diagnosis not present

## 2016-11-27 DIAGNOSIS — M170 Bilateral primary osteoarthritis of knee: Secondary | ICD-10-CM | POA: Diagnosis not present

## 2016-11-27 DIAGNOSIS — D692 Other nonthrombocytopenic purpura: Secondary | ICD-10-CM | POA: Diagnosis not present

## 2016-11-27 DIAGNOSIS — K449 Diaphragmatic hernia without obstruction or gangrene: Secondary | ICD-10-CM | POA: Diagnosis not present

## 2016-11-27 DIAGNOSIS — E7800 Pure hypercholesterolemia, unspecified: Secondary | ICD-10-CM | POA: Diagnosis not present

## 2016-11-27 DIAGNOSIS — Z6824 Body mass index (BMI) 24.0-24.9, adult: Secondary | ICD-10-CM | POA: Diagnosis not present

## 2016-11-27 DIAGNOSIS — M5416 Radiculopathy, lumbar region: Secondary | ICD-10-CM | POA: Diagnosis not present

## 2016-11-27 DIAGNOSIS — E038 Other specified hypothyroidism: Secondary | ICD-10-CM | POA: Diagnosis not present

## 2016-11-27 DIAGNOSIS — I872 Venous insufficiency (chronic) (peripheral): Secondary | ICD-10-CM | POA: Diagnosis not present

## 2017-03-31 DIAGNOSIS — M1712 Unilateral primary osteoarthritis, left knee: Secondary | ICD-10-CM | POA: Diagnosis not present

## 2017-03-31 DIAGNOSIS — M1711 Unilateral primary osteoarthritis, right knee: Secondary | ICD-10-CM | POA: Diagnosis not present

## 2017-04-14 DIAGNOSIS — R3912 Poor urinary stream: Secondary | ICD-10-CM | POA: Diagnosis not present

## 2017-04-14 DIAGNOSIS — R350 Frequency of micturition: Secondary | ICD-10-CM | POA: Diagnosis not present

## 2017-04-14 DIAGNOSIS — R3914 Feeling of incomplete bladder emptying: Secondary | ICD-10-CM | POA: Diagnosis not present

## 2017-04-28 DIAGNOSIS — R3914 Feeling of incomplete bladder emptying: Secondary | ICD-10-CM | POA: Diagnosis not present

## 2017-05-12 DIAGNOSIS — M1711 Unilateral primary osteoarthritis, right knee: Secondary | ICD-10-CM | POA: Diagnosis not present

## 2017-05-12 DIAGNOSIS — M1712 Unilateral primary osteoarthritis, left knee: Secondary | ICD-10-CM | POA: Diagnosis not present

## 2017-05-20 DIAGNOSIS — Z125 Encounter for screening for malignant neoplasm of prostate: Secondary | ICD-10-CM | POA: Diagnosis not present

## 2017-05-20 DIAGNOSIS — I10 Essential (primary) hypertension: Secondary | ICD-10-CM | POA: Diagnosis not present

## 2017-05-20 DIAGNOSIS — E7800 Pure hypercholesterolemia, unspecified: Secondary | ICD-10-CM | POA: Diagnosis not present

## 2017-05-20 DIAGNOSIS — Z0000 Encounter for general adult medical examination without abnormal findings: Secondary | ICD-10-CM | POA: Diagnosis not present

## 2017-05-20 DIAGNOSIS — E038 Other specified hypothyroidism: Secondary | ICD-10-CM | POA: Diagnosis not present

## 2017-05-27 DIAGNOSIS — R69 Illness, unspecified: Secondary | ICD-10-CM | POA: Diagnosis not present

## 2017-05-27 DIAGNOSIS — D692 Other nonthrombocytopenic purpura: Secondary | ICD-10-CM | POA: Diagnosis not present

## 2017-05-27 DIAGNOSIS — N401 Enlarged prostate with lower urinary tract symptoms: Secondary | ICD-10-CM | POA: Diagnosis not present

## 2017-05-27 DIAGNOSIS — I10 Essential (primary) hypertension: Secondary | ICD-10-CM | POA: Diagnosis not present

## 2017-05-27 DIAGNOSIS — I872 Venous insufficiency (chronic) (peripheral): Secondary | ICD-10-CM | POA: Diagnosis not present

## 2017-05-27 DIAGNOSIS — M5416 Radiculopathy, lumbar region: Secondary | ICD-10-CM | POA: Diagnosis not present

## 2017-05-27 DIAGNOSIS — Z0000 Encounter for general adult medical examination without abnormal findings: Secondary | ICD-10-CM | POA: Diagnosis not present

## 2017-05-27 DIAGNOSIS — Z23 Encounter for immunization: Secondary | ICD-10-CM | POA: Diagnosis not present

## 2017-05-27 DIAGNOSIS — E7800 Pure hypercholesterolemia, unspecified: Secondary | ICD-10-CM | POA: Diagnosis not present

## 2017-05-27 DIAGNOSIS — E038 Other specified hypothyroidism: Secondary | ICD-10-CM | POA: Diagnosis not present

## 2017-06-09 DIAGNOSIS — M1712 Unilateral primary osteoarthritis, left knee: Secondary | ICD-10-CM | POA: Diagnosis not present

## 2017-11-26 DIAGNOSIS — E039 Hypothyroidism, unspecified: Secondary | ICD-10-CM | POA: Diagnosis not present

## 2017-11-26 DIAGNOSIS — I10 Essential (primary) hypertension: Secondary | ICD-10-CM | POA: Diagnosis not present

## 2017-11-26 DIAGNOSIS — K449 Diaphragmatic hernia without obstruction or gangrene: Secondary | ICD-10-CM | POA: Diagnosis not present

## 2017-11-26 DIAGNOSIS — I872 Venous insufficiency (chronic) (peripheral): Secondary | ICD-10-CM | POA: Diagnosis not present

## 2017-11-26 DIAGNOSIS — D126 Benign neoplasm of colon, unspecified: Secondary | ICD-10-CM | POA: Diagnosis not present

## 2017-11-26 DIAGNOSIS — E7800 Pure hypercholesterolemia, unspecified: Secondary | ICD-10-CM | POA: Diagnosis not present

## 2017-11-26 DIAGNOSIS — D692 Other nonthrombocytopenic purpura: Secondary | ICD-10-CM | POA: Diagnosis not present

## 2017-11-26 DIAGNOSIS — R69 Illness, unspecified: Secondary | ICD-10-CM | POA: Diagnosis not present

## 2017-11-26 DIAGNOSIS — M170 Bilateral primary osteoarthritis of knee: Secondary | ICD-10-CM | POA: Diagnosis not present

## 2017-11-26 DIAGNOSIS — N401 Enlarged prostate with lower urinary tract symptoms: Secondary | ICD-10-CM | POA: Diagnosis not present

## 2018-05-25 DIAGNOSIS — E7800 Pure hypercholesterolemia, unspecified: Secondary | ICD-10-CM | POA: Diagnosis not present

## 2018-05-25 DIAGNOSIS — I10 Essential (primary) hypertension: Secondary | ICD-10-CM | POA: Diagnosis not present

## 2018-05-25 DIAGNOSIS — Z125 Encounter for screening for malignant neoplasm of prostate: Secondary | ICD-10-CM | POA: Diagnosis not present

## 2018-05-25 DIAGNOSIS — E038 Other specified hypothyroidism: Secondary | ICD-10-CM | POA: Diagnosis not present

## 2018-05-25 DIAGNOSIS — R82998 Other abnormal findings in urine: Secondary | ICD-10-CM | POA: Diagnosis not present

## 2018-06-01 DIAGNOSIS — N401 Enlarged prostate with lower urinary tract symptoms: Secondary | ICD-10-CM | POA: Diagnosis not present

## 2018-06-01 DIAGNOSIS — I10 Essential (primary) hypertension: Secondary | ICD-10-CM | POA: Diagnosis not present

## 2018-06-01 DIAGNOSIS — Z125 Encounter for screening for malignant neoplasm of prostate: Secondary | ICD-10-CM | POA: Diagnosis not present

## 2018-06-01 DIAGNOSIS — M170 Bilateral primary osteoarthritis of knee: Secondary | ICD-10-CM | POA: Diagnosis not present

## 2018-06-01 DIAGNOSIS — E038 Other specified hypothyroidism: Secondary | ICD-10-CM | POA: Diagnosis not present

## 2018-06-01 DIAGNOSIS — Z23 Encounter for immunization: Secondary | ICD-10-CM | POA: Diagnosis not present

## 2018-06-01 DIAGNOSIS — D126 Benign neoplasm of colon, unspecified: Secondary | ICD-10-CM | POA: Diagnosis not present

## 2018-06-01 DIAGNOSIS — M5416 Radiculopathy, lumbar region: Secondary | ICD-10-CM | POA: Diagnosis not present

## 2018-06-01 DIAGNOSIS — Z0000 Encounter for general adult medical examination without abnormal findings: Secondary | ICD-10-CM | POA: Diagnosis not present

## 2018-06-01 DIAGNOSIS — E7800 Pure hypercholesterolemia, unspecified: Secondary | ICD-10-CM | POA: Diagnosis not present

## 2018-06-01 DIAGNOSIS — I872 Venous insufficiency (chronic) (peripheral): Secondary | ICD-10-CM | POA: Diagnosis not present

## 2018-12-16 DIAGNOSIS — M170 Bilateral primary osteoarthritis of knee: Secondary | ICD-10-CM | POA: Diagnosis not present

## 2018-12-16 DIAGNOSIS — E7800 Pure hypercholesterolemia, unspecified: Secondary | ICD-10-CM | POA: Diagnosis not present

## 2018-12-16 DIAGNOSIS — E039 Hypothyroidism, unspecified: Secondary | ICD-10-CM | POA: Diagnosis not present

## 2018-12-16 DIAGNOSIS — N401 Enlarged prostate with lower urinary tract symptoms: Secondary | ICD-10-CM | POA: Diagnosis not present

## 2018-12-16 DIAGNOSIS — I10 Essential (primary) hypertension: Secondary | ICD-10-CM | POA: Diagnosis not present

## 2018-12-16 DIAGNOSIS — I872 Venous insufficiency (chronic) (peripheral): Secondary | ICD-10-CM | POA: Diagnosis not present

## 2018-12-16 DIAGNOSIS — D692 Other nonthrombocytopenic purpura: Secondary | ICD-10-CM | POA: Diagnosis not present

## 2018-12-16 DIAGNOSIS — D126 Benign neoplasm of colon, unspecified: Secondary | ICD-10-CM | POA: Diagnosis not present

## 2019-06-03 DIAGNOSIS — I10 Essential (primary) hypertension: Secondary | ICD-10-CM | POA: Diagnosis not present

## 2019-06-03 DIAGNOSIS — Z125 Encounter for screening for malignant neoplasm of prostate: Secondary | ICD-10-CM | POA: Diagnosis not present

## 2019-06-03 DIAGNOSIS — E038 Other specified hypothyroidism: Secondary | ICD-10-CM | POA: Diagnosis not present

## 2019-06-03 DIAGNOSIS — N401 Enlarged prostate with lower urinary tract symptoms: Secondary | ICD-10-CM | POA: Diagnosis not present

## 2019-06-03 DIAGNOSIS — Z7689 Persons encountering health services in other specified circumstances: Secondary | ICD-10-CM | POA: Diagnosis not present

## 2019-06-10 DIAGNOSIS — E039 Hypothyroidism, unspecified: Secondary | ICD-10-CM | POA: Diagnosis not present

## 2019-06-10 DIAGNOSIS — Z1339 Encounter for screening examination for other mental health and behavioral disorders: Secondary | ICD-10-CM | POA: Diagnosis not present

## 2019-06-10 DIAGNOSIS — K449 Diaphragmatic hernia without obstruction or gangrene: Secondary | ICD-10-CM | POA: Diagnosis not present

## 2019-06-10 DIAGNOSIS — D126 Benign neoplasm of colon, unspecified: Secondary | ICD-10-CM | POA: Diagnosis not present

## 2019-06-10 DIAGNOSIS — R972 Elevated prostate specific antigen [PSA]: Secondary | ICD-10-CM | POA: Diagnosis not present

## 2019-06-10 DIAGNOSIS — Z1331 Encounter for screening for depression: Secondary | ICD-10-CM | POA: Diagnosis not present

## 2019-06-10 DIAGNOSIS — E7800 Pure hypercholesterolemia, unspecified: Secondary | ICD-10-CM | POA: Diagnosis not present

## 2019-06-10 DIAGNOSIS — D72819 Decreased white blood cell count, unspecified: Secondary | ICD-10-CM | POA: Diagnosis not present

## 2019-06-10 DIAGNOSIS — Z0000 Encounter for general adult medical examination without abnormal findings: Secondary | ICD-10-CM | POA: Diagnosis not present

## 2019-06-10 DIAGNOSIS — N401 Enlarged prostate with lower urinary tract symptoms: Secondary | ICD-10-CM | POA: Diagnosis not present

## 2019-06-10 DIAGNOSIS — I10 Essential (primary) hypertension: Secondary | ICD-10-CM | POA: Diagnosis not present

## 2019-06-10 DIAGNOSIS — D692 Other nonthrombocytopenic purpura: Secondary | ICD-10-CM | POA: Diagnosis not present

## 2019-12-14 DIAGNOSIS — R972 Elevated prostate specific antigen [PSA]: Secondary | ICD-10-CM | POA: Diagnosis not present

## 2019-12-14 DIAGNOSIS — K449 Diaphragmatic hernia without obstruction or gangrene: Secondary | ICD-10-CM | POA: Diagnosis not present

## 2019-12-14 DIAGNOSIS — D72819 Decreased white blood cell count, unspecified: Secondary | ICD-10-CM | POA: Diagnosis not present

## 2019-12-14 DIAGNOSIS — I10 Essential (primary) hypertension: Secondary | ICD-10-CM | POA: Diagnosis not present

## 2019-12-14 DIAGNOSIS — M170 Bilateral primary osteoarthritis of knee: Secondary | ICD-10-CM | POA: Diagnosis not present

## 2019-12-14 DIAGNOSIS — E039 Hypothyroidism, unspecified: Secondary | ICD-10-CM | POA: Diagnosis not present

## 2019-12-14 DIAGNOSIS — N401 Enlarged prostate with lower urinary tract symptoms: Secondary | ICD-10-CM | POA: Diagnosis not present

## 2019-12-14 DIAGNOSIS — E7800 Pure hypercholesterolemia, unspecified: Secondary | ICD-10-CM | POA: Diagnosis not present

## 2019-12-14 DIAGNOSIS — I872 Venous insufficiency (chronic) (peripheral): Secondary | ICD-10-CM | POA: Diagnosis not present

## 2019-12-14 DIAGNOSIS — D692 Other nonthrombocytopenic purpura: Secondary | ICD-10-CM | POA: Diagnosis not present

## 2020-01-08 ENCOUNTER — Other Ambulatory Visit: Payer: Self-pay

## 2020-01-08 ENCOUNTER — Encounter (HOSPITAL_COMMUNITY): Payer: Self-pay

## 2020-01-08 ENCOUNTER — Emergency Department (HOSPITAL_COMMUNITY)
Admission: EM | Admit: 2020-01-08 | Discharge: 2020-01-08 | Disposition: A | Discharge status: Home / Self Care | Payer: Medicare HMO | Attending: Emergency Medicine | Admitting: Emergency Medicine

## 2020-01-08 DIAGNOSIS — R42 Dizziness and giddiness: Secondary | ICD-10-CM | POA: Insufficient documentation

## 2020-01-08 DIAGNOSIS — E039 Hypothyroidism, unspecified: Secondary | ICD-10-CM | POA: Insufficient documentation

## 2020-01-08 DIAGNOSIS — Z87891 Personal history of nicotine dependence: Secondary | ICD-10-CM | POA: Diagnosis not present

## 2020-01-08 DIAGNOSIS — Z79899 Other long term (current) drug therapy: Secondary | ICD-10-CM | POA: Diagnosis not present

## 2020-01-08 DIAGNOSIS — R110 Nausea: Secondary | ICD-10-CM | POA: Diagnosis not present

## 2020-01-08 DIAGNOSIS — I10 Essential (primary) hypertension: Secondary | ICD-10-CM | POA: Diagnosis not present

## 2020-01-08 DIAGNOSIS — R002 Palpitations: Secondary | ICD-10-CM | POA: Diagnosis not present

## 2020-01-08 LAB — CBC
HCT: 43.4 % (ref 39.0–52.0)
Hemoglobin: 14.4 g/dL (ref 13.0–17.0)
MCH: 32 pg (ref 26.0–34.0)
MCHC: 33.2 g/dL (ref 30.0–36.0)
MCV: 96.4 fL (ref 80.0–100.0)
Platelets: 253 10*3/uL (ref 150–400)
RBC: 4.5 MIL/uL (ref 4.22–5.81)
RDW: 12.8 % (ref 11.5–15.5)
WBC: 4.8 10*3/uL (ref 4.0–10.5)
nRBC: 0 % (ref 0.0–0.2)

## 2020-01-08 LAB — BASIC METABOLIC PANEL
Anion gap: 10 (ref 5–15)
BUN: 13 mg/dL (ref 8–23)
CO2: 25 mmol/L (ref 22–32)
Calcium: 9.8 mg/dL (ref 8.9–10.3)
Chloride: 103 mmol/L (ref 98–111)
Creatinine, Ser: 1.19 mg/dL (ref 0.61–1.24)
GFR calc Af Amer: >60 mL/min (ref >60)
GFR calc non Af Amer: 57 mL/min — ABNORMAL LOW (ref >60)
Glucose, Bld: 129 mg/dL — ABNORMAL HIGH (ref 70–99)
Potassium: 3.7 mmol/L (ref 3.5–5.1)
Sodium: 138 mmol/L (ref 135–145)

## 2020-01-08 LAB — CBG MONITORING, ED: Glucose-Capillary: 109 mg/dL — ABNORMAL HIGH (ref 70–99)

## 2020-01-08 LAB — MAGNESIUM: Magnesium: 2.1 mg/dL (ref 1.7–2.4)

## 2020-01-08 LAB — TSH: TSH: 0.57 u[IU]/mL (ref 0.350–4.500)

## 2020-01-08 LAB — T4, FREE: Free T4: 1.5 ng/dL — ABNORMAL HIGH (ref 0.61–1.12)

## 2020-01-08 LAB — TROPONIN I (HIGH SENSITIVITY): Troponin I (High Sensitivity): 5 ng/L (ref <18)

## 2020-01-08 MED ORDER — DILTIAZEM HCL ER COATED BEADS 180 MG PO CP24
180.0000 mg | ORAL_CAPSULE | Freq: Every day | ORAL | 0 refills | Status: DC
Start: 2020-01-08 — End: 2020-01-13

## 2020-01-08 MED ORDER — SODIUM CHLORIDE 0.9 % IV BOLUS
1000.0000 mL | Freq: Once | INTRAVENOUS | Status: AC
  Administered 2020-01-08: 1000 mL via INTRAVENOUS

## 2020-01-08 MED ORDER — ADENOSINE 6 MG/2ML IV SOLN: 6.0000 mg | Freq: Once | INTRAVENOUS | Status: DC

## 2020-01-08 MED ORDER — DILTIAZEM HCL ER COATED BEADS 180 MG PO CP24
180.0000 mg | ORAL_CAPSULE | Freq: Every day | ORAL | Status: DC
  Administered 2020-01-08: 180 mg via ORAL
  Filled 2020-01-08 (×2): qty 1

## 2020-01-08 MED ORDER — SODIUM CHLORIDE 0.9% FLUSH: 3.0000 mL | Freq: Once | INTRAVENOUS | Status: DC

## 2020-01-08 NOTE — ED Triage Notes (Signed)
Patient complains of intermittent dizziness x 2 hours, describes as more positional. Also complains of several episodes of heart racing. Patient has no drift, speech clear, MAE X 4. No facial droop

## 2020-01-08 NOTE — ED Notes (Signed)
Pt tried to pee was unable to will try later

## 2020-01-08 NOTE — ED Provider Notes (Signed)
[facility] DEPARTMENT Provider Note   CSN: [id number] Arrival date & time: 01/08/20  2952     History Chief Complaint  Patient presents with  . Palpitations  . Dizziness    Hunter Key is a 79 y.o. male.  The history is provided by the patient.  Palpitations Palpitations quality:  Fast Onset quality:  Sudden Timing:  Constant Progression:  Unchanged Chronicity:  New Context comment:  Patient woke up this morning and developed some palpitations with dizziness and nasuea. No chest pain or SOB. No falls. Relieved by:  Nothing Worsened by:  Nothing Associated symptoms: dizziness   Associated symptoms: no back pain, no chest pain, no chest pressure, no cough, no leg pain, no numbness, no shortness of breath, no syncope and no vomiting   Risk factors: no heart disease and no hx of atrial fibrillation        Past Medical History:  Diagnosis Date  . Arthritis   . Chest pressure 12/2014  . Enlarged prostate   . GERD (gastroesophageal reflux disease)    Takes Prilosec  . Hyperlipidemia   . Hypertension    Takes Quinapril  . Hypothyroidism    Takes Synthroid  . Thyroid disease     Patient Active Problem List   Diagnosis Date Noted  . Chest pressure 12/13/2014  . Chest pain 12/13/2014  . Chest discomfort   . COLONIC POLYPS, ADENOMATOUS 08/29/2007  . HYPOTHYROIDISM 08/29/2007  . HYPERCHOLESTEROLEMIA 08/29/2007  . Essential hypertension 08/29/2007  . HEMORRHOIDS, INTERNAL 08/29/2007  . GERD 08/29/2007  . Hiatal hernia 08/29/2007  . DIVERTICULOSIS, COLON 11/17/2001    Past Surgical History:  Procedure Laterality Date  . BIOPSY SHOULDER     Left shoulder biopsy  . CARDIAC CATHETERIZATION     Clean cath; unaware of where he had cath done at  . COLONOSCOPY W/ POLYPECTOMY    . HERNIA REPAIR  10/10/11   Open LIH repair w/mesh  . INGUINAL HERNIA REPAIR  10/10/2011   Procedure: HERNIA REPAIR INGUINAL ADULT;  Surgeon: Madilyn Hook, DO;   Location: [facility];  Service: General;  Laterality: Left;  OPEN LEFT INGUINAL HERNIA REPAIR  . PROSTATE BIOPSY         Family History  Problem Relation Age of Onset  . Anesthesia problems Neg Hx   . Hypotension Neg Hx   . Malignant hyperthermia Neg Hx   . Pseudochol deficiency Neg Hx   . Colon cancer Neg Hx     Social History   Tobacco Use  . Smoking status: Former Smoker    Quit date: 10/01/1962    Years since quitting: 57.3  . Smokeless tobacco: Former Systems developer    Quit date: 10/01/1962  Substance Use Topics  . Alcohol use: Yes    Comment: occ  . Drug use: No    Home Medications Prior to Admission medications   Medication Sig Start Date End Date Taking? Authorizing Provider  atorvastatin (LIPITOR) 10 MG tablet Take 10 mg by mouth daily.  08/20/11   [provider]  ciprofloxacin (CIPRO) 250 MG tablet Take 1 tablet (250 mg total) by mouth every 12 (twelve) hours. 01/12/15   Mabe, Forbes Cellar, MD  diltiazem (CARDIZEM CD) 180 MG 24 hr capsule Take 1 capsule (180 mg total) by mouth daily. 01/08/20 02/07/20  Ahmani Prehn, DO  levothyroxine (SYNTHROID, LEVOTHROID) 150 MCG tablet Take 150 mcg by mouth daily before breakfast.    [provider]  Multiple Vitamins-Minerals (MULTIVITAMINS THER. W/MINERALS)  TABS Take 1 tablet by mouth daily.    [provider]  omeprazole (PRILOSEC) 20 MG capsule Take 1 capsule (20 mg total) by mouth 2 (two) times daily before a meal. 12/14/14   Verlee Monte, MD  quinapril (ACCUPRIL) 20 MG tablet Take 20 mg by mouth daily.  09/03/11   [provider]  Tamsulosin HCl (FLOMAX) 0.4 MG CAPS Take 0.4 mg by mouth 2 (two) times daily.  10/29/11   [provider]    Allergies    Patient has no known allergies.  Review of Systems   Review of Systems  Constitutional: Negative for chills and fever.  HENT: Negative for ear pain and sore throat.   Eyes: Negative for pain and visual disturbance.  Respiratory: Negative for cough and  shortness of breath.   Cardiovascular: Positive for palpitations. Negative for chest pain and syncope.  Gastrointestinal: Negative for abdominal pain and vomiting.  Genitourinary: Negative for dysuria and hematuria.  Musculoskeletal: Negative for arthralgias and back pain.  Skin: Negative for color change and rash.  Neurological: Positive for dizziness. Negative for seizures, syncope and numbness.  All other systems reviewed and are negative.   Physical Exam Updated Vital Signs  ED Triage Vitals  Enc Vitals Group     BP 01/08/20 0851 (!) 154/95     Pulse Rate 01/08/20 0851 (!) 120     Resp 01/08/20 0851 16     Temp 01/08/20 0851 98 F (36.7 C)     Temp Source 01/08/20 0851 Oral     SpO2 01/08/20 0851 98 %     Weight --      Height --      Head Circumference --      Peak Flow --      Pain Score 01/08/20 0854 0     Pain Loc --      Pain Edu? --      Excl. in [place]? --     Physical Exam Vitals and nursing note reviewed.  Constitutional:      General: He is not in acute distress.    Appearance: He is well-developed. He is not ill-appearing.  HENT:     Head: Normocephalic and atraumatic.     Nose: Nose normal.     Mouth/Throat:     Mouth: Mucous membranes are moist.  Eyes:     Extraocular Movements: Extraocular movements intact.     Conjunctiva/sclera: Conjunctivae normal.     Pupils: Pupils are equal, round, and reactive to light.  Cardiovascular:     Rate and Rhythm: Regular rhythm. Tachycardia present.     Pulses: Normal pulses.     Heart sounds: Normal heart sounds. No murmur.  Pulmonary:     Effort: Pulmonary effort is normal. No respiratory distress.     Breath sounds: Normal breath sounds.  Abdominal:     Palpations: Abdomen is soft.     Tenderness: There is no abdominal tenderness.  Musculoskeletal:        General: Normal range of motion.     Cervical back: Normal range of motion and neck supple.  Skin:    General: Skin is warm and dry.     Capillary  Refill: Capillary refill takes less than 2 seconds.  Neurological:     General: No focal deficit present.     Mental Status: He is alert and oriented to person, place, and time.     Cranial Nerves: No cranial nerve deficit.     Sensory:  No sensory deficit.     Motor: No weakness.     Coordination: Coordination normal.     Comments: 5+ out of 5 strength throughout, normal sensation, no drift, normal finger-nose-finger, normal speech     ED Results / Procedures / Treatments   Labs (all labs ordered are listed, but only abnormal results are displayed) Labs Reviewed  BASIC METABOLIC PANEL - Abnormal; Notable for the following components:      Result Value   Glucose, Bld 129 (*)    GFR calc non Af Amer 57 (*)    All other components within normal limits  T4, FREE - Abnormal; Notable for the following components:   Free T4 1.50 (*)    All other components within normal limits  CBG MONITORING, ED - Abnormal; Notable for the following components:   Glucose-Capillary 109 (*)    All other components within normal limits  CBC  TSH  MAGNESIUM  TROPONIN I (HIGH SENSITIVITY)    EKG EKG Interpretation  Date/Time:  Saturday January 08 2020 10:08:52 EDT Ventricular Rate:  97 PR Interval:  164 QRS Duration: 161 QT Interval:  388 QTC Calculation: 493 R Axis:   -53 Text Interpretation: Sinus rhythm Ventricular premature complex Right bundle branch block LVH with IVCD and secondary repol abnrm = Confirmed by Lennice Sites [phone]) on 01/08/2020 10:14:10 AM   Radiology No results found.  Procedures Procedures (including critical care time)  Medications Ordered in ED Medications  sodium chloride flush (NS) 0.9 % injection 3 mL (has no administration in time range)  sodium chloride 0.9 % bolus 1,000 mL (has no administration in time range)  diltiazem (CARDIZEM CD) 24 hr capsule 180 mg (has no administration in time range)    ED Course  I have reviewed the triage vital signs and the  nursing notes.  Pertinent labs & imaging results that were available during my care of the patient were reviewed by me and considered in my medical decision making (see chart for details).    MDM Rules/Calculators/A&P                      Hunter Key is an 79 year old male with history of hypertension, thyroid disease, high cholesterol presents the ED with palpitations, dizziness.  Patient tachycardic to 130s upon arrival, hypertensive to the 170s but otherwise normal vitals.  Patient states that he woke up this morning feeling okay and developed some palpitations.  Has had some intermittent dizziness.  Denies any chest pain or shortness of breath.  No weakness in his arms or legs.  Neurological exam is overall normal.  He has not passed out.  Denies any nausea or vomiting diarrhea.  EKG shows sinus tachycardia with PACs.  Appears to be fascicular block.  Some ST depressions and T wave inversions in 1, aVL, V1 V2.  Possible an atrial tachycardia.  No current chest pain.  Does not appear to have any changes on EKG to suggest STEMI.  We will get basic labs including thyroid studies, magnesium.  Will give fluid bolus.  Will reevaluate.  Will try to fix any electrolyte abnormalities.  However believe symptoms are secondary to palpitations today.  Does not appear to be in A. fib or a flutter.  Not appear to be SVT.  Anticipate touching base of cardiology if no other reason for tachycardia based off of lab work.  Talked with Dr. Einar Gip with cardiology.  After IV fluids patient did improve to a sinus tachycardia.  On original EKGs did show possible atrial tachycardia we will start diltiazem p.o. and have him follow-up outpatient.  Troponin was normal.  No other significant anemia, electrolyte abnormality, kidney injury.  TSH is normal.  Free T4 slightly elevated at 1.5.  Could be the reason for some of his palpitations.  We will have him follow-up with his primary care doctor for any medication adjustment.   But suspect some foci of atrial tachycardia and dehydration as mostly the reason for symptoms today.  Discharged in good condition.  Understands follow-up with cardiology and primary care.  Understands return precautions.  This chart was dictated using voice recognition software.  Despite best efforts to proofread,  errors can occur which can change the documentation meaning.    Final Clinical Impression(s) / ED Diagnoses Final diagnoses:  Palpitations    Rx / DC Orders ED Discharge Orders         Ordered    diltiazem (CARDIZEM CD) 180 MG 24 hr capsule  Daily     01/08/20 1054           [place], Quita Skye, DO 01/08/20 1055

## 2020-01-08 NOTE — ED Notes (Signed)
Patient verbalizes understanding of discharge instructions. Opportunity for questioning and answers were provided. Armband removed by staff, pt discharged from ED ambulatory w/ wife 

## 2020-01-12 NOTE — Progress Notes (Signed)
Primary Physician/Referring:  Haywood Pao, MD  Patient ID: Hunter Key, male    DOB: [birth year], 79 y.o.   MRN: [id number]  Chief Complaint  Patient presents with  . Palpitations    HOSPITAL FOLLOW UP  . New Patient (Initial Visit)   HPI:    Hunter Key  is a 79 y.o. Caucasian with hypertension, hyperlipidemia, moderate hiatal hernia, hypothyroidism was evaluated in the emergency room on 01/08/2020 when he presented with visual disturbances when he was waking around the house and felt dizzy, palpitations, shortness of breath and dizziness.  He was found to have sinus  tachycardia, spontaneously resolved with intravenous metoprolol, labs within normal limits except for mildly elevated T4, was discharged home and recommended cardiology follow-up. CT heat negative for stroke and MI ruled out.  Last Saturday (5 days ago) patient experienced acute onset of dizziness, dry mouth, and heart palpitations while standing at home and no known exacerbating activity. He has had less severe episodes in the distant past that had resolved with rest and was attributed to dehydration while working in the heat. This episode seemed worse and did not spontaneously resolve so presented to ED. Evaluation for stroke and ACS were negative. EKG showed sinus tachycardia with LVH and RBBB. Patient symptoms resolved and vitals returned to normal with treatment with metoprolol.  Patient was sent home with diltiazem 180mg  qd and follow up with cardiology. Since discharge, patient has been abstaining from beer, coffee, and chocolate as instructed. He feels a little more tired but has no recurrent episodes and feels mostly at baseline. At baseline, he is able to ambulate normally around his house and completes yard work without any SOB, DOE, CP, claudication symptoms. Had Left-heart-catheterization in 2012 which showed patent vessels diffusely. Blood pressures when checked irregularly at home have been "high" despite  adherence with medical therapy. Patient feels overall well today and is asymptomatic.   Past Medical History:  Diagnosis Date  . Arthritis   . Chest pressure 12/2014  . Enlarged prostate   . GERD (gastroesophageal reflux disease)    Takes Prilosec  . Hyperlipidemia   . Hypertension    Takes Quinapril  . Hypothyroidism    Takes Synthroid  . Thyroid disease    Past Surgical History:  Procedure Laterality Date  . BIOPSY SHOULDER     Left shoulder biopsy  . CARDIAC CATHETERIZATION     Clean cath; unaware of where he had cath done at  . COLONOSCOPY W/ POLYPECTOMY    . HERNIA REPAIR  10/10/11   Open LIH repair w/mesh  . INGUINAL HERNIA REPAIR  10/10/2011   Procedure: HERNIA REPAIR INGUINAL ADULT;  Surgeon: Madilyn Hook, DO;  Location: [facility];  Service: General;  Laterality: Left;  OPEN LEFT INGUINAL HERNIA REPAIR  . PROSTATE BIOPSY     Family History  Problem Relation Age of Onset  . Stroke Father   . CAD Brother   . CAD Brother   . Anesthesia problems Neg Hx   . Hypotension Neg Hx   . Malignant hyperthermia Neg Hx   . Pseudochol deficiency Neg Hx   . Colon cancer Neg Hx     Social History   Tobacco Use  . Smoking status: Former Smoker    Quit date: 10/01/1962    Years since quitting: 57.3  . Smokeless tobacco: Former Systems developer    Quit date: 10/01/1962  Substance Use Topics  . Alcohol use: Yes    Comment: occ   Marital  Status: Married  ROS  Review of Systems  Cardiovascular: Negative for dyspnea on exertion, leg swelling and syncope.  Gastrointestinal: Negative for melena.   Objective  Blood pressure (!) 148/89, pulse 91, height 6\' 1"  (1.854 m), weight 175 lb (79.4 kg).  Vitals with BMI 01/13/2020 01/13/2020 01/08/2020  Height - 6\' 1"  -  Weight - 175 lbs -  BMI - 08.14 -  Systolic 481 856 314  Diastolic 89 92 93  Pulse 91 - 107     Physical Exam  Constitutional: He appears well-developed and well-nourished. No distress.  Cardiovascular: Normal rate, regular rhythm and  intact distal pulses. Exam reveals no gallop.  No murmur heard. No leg edema, no JVD.   Pulmonary/Chest: Effort normal and breath sounds normal. No accessory muscle usage.  Abdominal: Soft. Bowel sounds are normal.   Laboratory examination:   Recent Labs    01/08/20 0900  NA 138  K 3.7  CL 103  CO2 25  GLUCOSE 129*  BUN 13  CREATININE 1.19  CALCIUM 9.8  GFRNONAA 57*  GFRAA >60   estimated creatinine clearance is 55.6 mL/min (by C-G formula based on SCr of 1.19 mg/dL).  CMP Latest Ref Rng & Units 01/08/2020 01/12/2015 12/13/2014  Glucose 70 - 99 mg/dL 129(H) 103(H) 116(H)  BUN 8 - 23 mg/dL 13 5(L) 10  Creatinine 0.61 - 1.24 mg/dL 1.19 0.96 1.07  Sodium 135 - 145 mmol/L 138 136 138  Potassium 3.5 - 5.1 mmol/L 3.7 3.6 3.6  Chloride 98 - 111 mmol/L 103 104 104  CO2 22 - 32 mmol/L 25 24 24   Calcium 8.9 - 10.3 mg/dL 9.8 9.2 9.5  Total Protein - - - -  Total Bilirubin - - - -  Alkaline Phos - - - -  AST - - - -  ALT - - - -   CBC Latest Ref Rng & Units 01/08/2020 01/12/2015 12/13/2014  WBC 4.0 - 10.5 K/uL 4.8 7.3 3.2(L)  Hemoglobin 13.0 - 17.0 g/dL 14.4 14.0 15.1  Hematocrit 39 - 52 % 43.4 40.9 44.7  Platelets 150 - 400 K/uL 253 244 227       Component Value Date/Time   CHOL 132 12/14/2014 0332   TRIG 88 12/14/2014 0332   HDL 50 12/14/2014 0332   CHOLHDL 2.6 12/14/2014 0332   VLDL 18 12/14/2014 0332   LDLCALC 64 12/14/2014 0332    HEMOGLOBIN A1C No results found for: HGBA1C, MPG TSH Recent Labs    01/08/20 0933  TSH 0.570    External labs:  Glucose Random 129.000 01/08/2020  BUN 13.000 01/08/2020 Creatinine, Serum 1.190 01/08/2020  TSH 0.570 01/08/2020  PSA 4.600 ng/ 06/04/2019  HDL 65 MG/DL 06/03/2019 LDL 70.000 mg 06/03/2019 Cholesterol, total 144.000 m 06/03/2019 Triglycerides 45.000 06/03/2019  Medications and allergies  No Known Allergies   Current Outpatient Medications  Medication Instructions  . atorvastatin (LIPITOR) 10 mg, Daily  . diltiazem  (CARDIZEM CD) 180 mg, Oral, Daily  . levothyroxine (SYNTHROID) 150 mcg, Oral, Daily before breakfast  . Multiple Vitamins-Minerals (MULTIVITAMINS THER. W/MINERALS) TABS 1 tablet, Daily  . olmesartan-hydrochlorothiazide (BENICAR HCT) 40-12.5 MG tablet 1 tablet, Oral, BH-each morning  . omeprazole (PRILOSEC) 20 mg, Oral, 2 times daily before meals  . tamsulosin (FLOMAX) 0.4 mg, Oral, 2 times daily   Medications Discontinued During This Encounter  Medication Reason  . quinapril (ACCUPRIL) 20 MG tablet Change in therapy  . diltiazem (CARDIZEM CD) 180 MG 24 hr capsule Reorder  . ciprofloxacin (CIPRO) 250  MG tablet Completed Course    Radiology:   CT Angio Abd/Pel 12/13/2014:  No evidence of aortic dissection or aneurysm. No evidence of pulmonary embolism. Moderate-sized hiatal hernia. Significantly enlarged prostate gland with chronic bladder outlet obstruction. RIGHT inguinal hernia containing fat. Minimal sigmoid diverticulosis.  Cardiac Studies:   Coronary angiogram 10/15/2004: Normal coronary arteries, right dominant circulation.  Echocardiography 12/13/2014: - Left ventricle: The cavity size was normal. Wall thickness was increased in a pattern of moderate LVH. Systolic function was normal.  The estimated ejection fraction was in the range of 60% to 65%. Wall motion was normal; there were no regional wall motion abnormalities.  Doppler parameters are consistent with abnormal left ventricular relaxation (grade 1 diastolic dysfunction). The E/e&' ratio is <8, suggesting normal LV filling pressure.  - Aortic valve: Trileaflet. Sclerosis without stenosis. There was trivial regurgitation.  - Mitral valve: Mildly thickened leaflets . There was mild regurgitation.  - Left atrium: The atrium was normal in size.  - Right atrium: The atrium was at the upper limits of normal in size.  - Tricuspid valve: There was mild regurgitation.  - Pulmonary arteries: PA peak pressure: 29 mm Hg (S).  -  Inferior vena cava: The vessel was normal in size. The respirophasic diameter changes were in the normal range (>= 50%), consistent with normal central venous pressure.   EKG  EKG 01/15/2020: Normal sinus rhythm with rate of 84 bpm, left axis deviation, left anterior fascicular block. Bifascicular block. Anterolateral infarct old.  LVH with repolarization abnormality, cannot exclude high lateral ischemia.  Abnormal EKG.  PVC (3).    EKG 01/08/2020: Sinus tachycardia at rate of 114 bpm, left axis deviation, left anterior fascicular block.  Right bundle branch block.  Poor R wave progression, cannot exclude lateral infarct old.  LVH with repolarization abnormality, cannot exclude lateral ischemia.  PVC.  Assessment     ICD-10-CM   1. Palpitations  R00.2 EKG 12-Lead    CARDIAC EVENT MONITOR    diltiazem (CARDIZEM CD) 180 MG 24 hr capsule    LONG TERM MONITOR-LIVE TELEMETRY (3-14 DAYS)  2. Bifascicular block  I45.2 PCV ECHOCARDIOGRAM COMPLETE    CARDIAC EVENT MONITOR    LONG TERM MONITOR-LIVE TELEMETRY (3-14 DAYS)  3. Dyspnea on exertion  R06.00 PCV ECHOCARDIOGRAM COMPLETE  4. Primary hypertension  I10 olmesartan-hydrochlorothiazide (BENICAR HCT) 40-12.5 MG tablet    diltiazem (CARDIZEM CD) 180 MG 24 hr capsule  5. HYPERCHOLESTEROLEMIA  E78.00      Recommendations:   DE JAWORSKI  is a 79 y.o. Caucasian with hypertension, hyperlipidemia, moderate hiatal hernia, hypothyroidism was evaluated in the emergency room on 01/08/2020 when he presented with palpitations, shortness of breath and dizziness.  He was found to have sinus tachycardia, spontaneously resolved with intravenous metoprolol, labs within normal limits except for mildly elevated T4, was discharged home and recommended cardiology follow-up.  Last Saturday (5 days ago) patient experienced acute onset of dizziness, dry mouth, and heart palpitations while standing at home and no known exacerbating activity. He has had less severe  episodes in the distant past that had resolved with rest and was attributed to dehydration while working in the heat.  Patient feels overall well today.   vaso-vagal reaction, poorly controlled HTN chronically.  - continue diltiazem 180 qd - discontinue quinapril due to uncontrolled hypertension  - begin benicar 40-12.5 qd - echo - holter event monitor x2 weeks to exclude heart block in view of bifascicular block and f/u PVC. I suspect the  symptoms are more consistent with vasovagal episodes.  Counter pressure maneuver discussed. Best option to lay down if he experiences these episodes. OV in 4-6 weeks.   Adrian Prows, MD, [facility] 01/13/2020, 12:59 PM [facility] [place] Pager: [phone] Office: [phone]

## 2020-01-13 ENCOUNTER — Other Ambulatory Visit: Payer: Self-pay

## 2020-01-13 ENCOUNTER — Encounter: Payer: Self-pay | Admitting: Cardiology

## 2020-01-13 ENCOUNTER — Ambulatory Visit: Payer: Medicare HMO | Admitting: Cardiology

## 2020-01-13 VITALS — BP 148/89 | HR 91 | Ht 73.0 in | Wt 175.0 lb

## 2020-01-13 DIAGNOSIS — E7800 Pure hypercholesterolemia, unspecified: Secondary | ICD-10-CM

## 2020-01-13 DIAGNOSIS — I10 Essential (primary) hypertension: Secondary | ICD-10-CM

## 2020-01-13 DIAGNOSIS — I452 Bifascicular block: Secondary | ICD-10-CM | POA: Diagnosis not present

## 2020-01-13 DIAGNOSIS — R0609 Other forms of dyspnea: Secondary | ICD-10-CM | POA: Diagnosis not present

## 2020-01-13 DIAGNOSIS — R002 Palpitations: Secondary | ICD-10-CM

## 2020-01-13 MED ORDER — OLMESARTAN MEDOXOMIL-HCTZ 40-12.5 MG PO TABS: 1.0000 | ORAL_TABLET | ORAL | 2 refills | Status: DC

## 2020-01-13 MED ORDER — DILTIAZEM HCL ER COATED BEADS 180 MG PO CP24: 180.0000 mg | ORAL_CAPSULE | Freq: Every day | ORAL | 1 refills | Status: DC

## 2020-01-13 NOTE — Progress Notes (Deleted)
Primary Physician/Referring: Haywood Pao, MD  Patient ID: Oletha Cruel, male DOB: Sep 08, 1940, 79 y.o. MRN: [id number]  No chief complaint on file.   HPI:   CARTRELL BENTSEN is a 79 y.o. Caucasian with hypertension, hyperlipidemia, moderate hiatal hernia, hypothyroidism was evaluated in the emergency room on 01/08/2020 when he presented with palpitations, shortness of breath and dizziness. He was found to have sinus tachycardia, spontaneously resolved with intravenous metoprolol, labs within normal limits except for mildly elevated T4, was discharged home and recommended cardiology follow-up.   Last Saturday (5 days ago) patient experienced acute onset of dizziness, dry mouth, and heart palpitations while standing at home and no known exacerbating activity. He has had less severe episodes in the distant past that had resolved with rest and was attributed to dehydration while working in the heat. This episode seemed worse and did not spontaneously resolve so presented to ED. Evaluation for stroke and ACS were negative. EKG showed sinus tachycardia with LVH and RBBB. Patient symptoms resolved and vitals returned to normal with treatment with metoprolol.  Patient was sent home with diltiazem 180mg  qd and follow up with cardiology. Since discharge, patient has been abstaining from beer, coffee, and chocolate as instructed. He feels a little more tired but has no recurrent episodes and feels mostly at baseline. At baseline, he is able to ambulate normally around his house and completes yard work without any SOB, DOE, CP, claudication symptoms. Had Left-heart-catheterization in 2012 which showed patent vessels diffusely. Blood pressures when checked irregularly at home have been "high" despite adherence with medical therapy. Patient feels overall well today.  Physical exam benign besides ectopic beats.  A/P: vaso-vagal reaction, poorly controlled HTN chronically.  - continue diltiazem 180 qd -  discontinue quinapril - begin benicar 40-12.5 qd - echo - holter event monitor x2 weeks       Past Medical History:  Diagnosis Date  . Arthritis   . Chest pressure 12/2014  . Enlarged prostate   . GERD (gastroesophageal reflux disease)    Takes Prilosec  . Hyperlipidemia   . Hypertension    Takes Quinapril  . Hypothyroidism    Takes Synthroid  . Thyroid disease         Past Surgical History:  Procedure Laterality Date  . BIOPSY SHOULDER     Left shoulder biopsy  . CARDIAC CATHETERIZATION     Clean cath; unaware of where he had cath done at  . COLONOSCOPY W/ POLYPECTOMY    . HERNIA REPAIR  10/10/11   Open LIH repair w/mesh  . INGUINAL HERNIA REPAIR  10/10/2011   Procedure: HERNIA REPAIR INGUINAL ADULT; Surgeon: Madilyn Hook, DO; Location: [facility]; Service: General; Laterality: Left; OPEN LEFT INGUINAL HERNIA REPAIR  . PROSTATE BIOPSY          Family History  Problem Relation Age of Onset  . Anesthesia problems Neg Hx   . Hypotension Neg Hx   . Malignant hyperthermia Neg Hx   . Pseudochol deficiency Neg Hx   . Colon cancer Neg Hx    Social History        Tobacco Use  . Smoking status: Former Smoker    Quit date: 10/01/1962    Years since quitting: 57.3  . Smokeless tobacco: Former Systems developer    Quit date: 10/01/1962  Substance Use Topics  . Alcohol use: Yes    Comment: occ   Marital Status: Married  ROS  ***Review of Systems  Cardiovascular: Negative for dyspnea on exertion,  leg swelling and syncope.  Gastrointestinal: Negative for melena.   Objective  There were no vitals taken for this visit.  Vitals with BMI 01/08/2020 01/08/2020 01/08/2020  Height - - -  Weight - - -  BMI - - -  Systolic 412 878 676  Diastolic 93 84 93  Pulse 720 93 91   ***Physical Exam  Constitutional: He appears well-developed and well-nourished. No distress.  Cardiovascular: Normal rate, regular rhythm and intact distal pulses. Exam reveals no gallop.  No murmur heard. No leg edema, no  JVD.  Pulmonary/Chest: Effort normal and breath sounds normal. No accessory muscle usage.  Abdominal: Soft. Bowel sounds are normal.   Laboratory examination:      Recent Labs   01/08/20  0900  NA 138  K 3.7  CL 103  CO2 25  GLUCOSE 129*  BUN 13  CREATININE 1.19  CALCIUM 9.8  GFRNONAA 57*  GFRAA >60   CrCl cannot be calculated (Unknown ideal weight.).  CMP Latest Ref Rng & Units 01/08/2020 01/12/2015 12/13/2014  Glucose 70 - 99 mg/dL 129(H) 103(H) 116(H)  BUN 8 - 23 mg/dL 13 5(L) 10  Creatinine 0.61 - 1.24 mg/dL 1.19 0.96 1.07  Sodium 135 - 145 mmol/L 138 136 138  Potassium 3.5 - 5.1 mmol/L 3.7 3.6 3.6  Chloride 98 - 111 mmol/L 103 104 104  CO2 22 - 32 mmol/L 25 24 24   Calcium 8.9 - 10.3 mg/dL 9.8 9.2 9.5  Total Protein - - - -  Total Bilirubin - - - -  Alkaline Phos - - - -  AST - - - -  ALT - - - -   CBC Latest Ref Rng & Units 01/08/2020 01/12/2015 12/13/2014  WBC 4.0 - 10.5 K/uL 4.8 7.3 3.2(L)  Hemoglobin 13.0 - 17.0 g/dL 14.4 14.0 15.1  Hematocrit 39.0 - 52.0 % 43.4 40.9 44.7  Platelets 150 - 400 K/uL 253 244 227          Component Value Date/Time   CHOL 132 12/14/2014 0332   TRIG 88 12/14/2014 0332   HDL 50 12/14/2014 0332   CHOLHDL 2.6 12/14/2014 0332   VLDL 18 12/14/2014 0332   LDLCALC 64 12/14/2014 0332   HEMOGLOBIN A1C  No results found for: HGBA1C, MPG  TSH     Recent Labs   01/08/20  0933  TSH 0.570   External labs:  ***Glucose Random 129.000 01/08/2020  BUN 13.000 01/08/2020  Creatinine, Serum 1.190 01/08/2020  TSH 0.570 01/08/2020  PSA 4.600 ng/ 06/04/2019  HDL 65 MG/DL 06/03/2019  LDL 70.000 mg 06/03/2019  Cholesterol, total 144.000 m 06/03/2019  Triglycerides 45.000 06/03/2019  Medications and allergies  No Known Allergies      Current Outpatient Medications  Medication Instructions  . atorvastatin (LIPITOR) 10 mg, Daily  . ciprofloxacin (CIPRO) 250 mg, Oral, Every 12 hours  . diltiazem (CARDIZEM CD) 180 mg, Oral, Daily  . levothyroxine  (SYNTHROID) 150 mcg, Oral, Daily before breakfast  . Multiple Vitamins-Minerals (MULTIVITAMINS THER. W/MINERALS) TABS 1 tablet, Daily  . omeprazole (PRILOSEC) 20 mg, Oral, 2 times daily before meals  . quinapril (ACCUPRIL) 20 mg, Daily  . tamsulosin (FLOMAX) 0.4 mg, Oral, 2 times daily   There are no discontinued medications.  Radiology:  CT Angio Abd/Pel 12/13/2014:  No evidence of aortic dissection or aneurysm.  No evidence of pulmonary embolism.  Moderate-sized hiatal hernia.  Significantly enlarged prostate gland with chronic bladder outlet obstruction.  RIGHT inguinal hernia containing fat.  Minimal sigmoid  diverticulosis.  Cardiac Studies:  Echocardiography 12/13/2014:  - Left ventricle: The cavity size was normal. Wall thickness was increased in a pattern of moderate LVH. Systolic function was normal.  The estimated ejection fraction was in the range of 60% to 65%. Wall motion was normal; there were no regional wall motion abnormalities.  Doppler parameters are consistent with abnormal left ventricular relaxation (grade 1 diastolic dysfunction). The E/e&' ratio is <8, suggesting normal LV filling pressure.  - Aortic valve: Trileaflet. Sclerosis without stenosis. There was trivial regurgitation.  - Mitral valve: Mildly thickened leaflets . There was mild regurgitation.  - Left atrium: The atrium was normal in size.  - Right atrium: The atrium was at the upper limits of normal in size.  - Tricuspid valve: There was mild regurgitation.  - Pulmonary arteries: PA peak pressure: 29 mm Hg (S).  - Inferior vena cava: The vessel was normal in size. The respirophasic diameter changes were in the normal range (>= 50%), consistent with normal central venous pressure.  EKG  *** EKG 01/08/2020: Sinus tachycardia at rate of 114 bpm, left axis deviation, left anterior fascicular block. Right bundle branch block. Poor R wave progression, cannot exclude lateral infarct old. LVH with repolarization  abnormality, cannot exclude lateral ischemia. PVC.  Assessment  No diagnosis found.  Recommendations:  HILLARD GOODWINE is a 79 y.o. Caucasian with hypertension, hyperlipidemia, moderate hiatal hernia, hypothyroidism was evaluated in the emergency room on 01/08/2020 when he presented with palpitations, shortness of breath and dizziness. He was found to have sinus tachycardia, spontaneously resolved with intravenous metoprolol, labs within normal limits except for mildly elevated T4, was discharged home and recommended cardiology follow-up.    Adrian Prows, MD, [facility]  01/12/2020, 12:47 PM  [facility]  Pager: [phone]  Office: [phone]

## 2020-01-18 ENCOUNTER — Ambulatory Visit: Payer: Medicare HMO

## 2020-01-18 ENCOUNTER — Other Ambulatory Visit: Payer: Self-pay

## 2020-01-18 DIAGNOSIS — I452 Bifascicular block: Secondary | ICD-10-CM

## 2020-01-18 DIAGNOSIS — R002 Palpitations: Secondary | ICD-10-CM

## 2020-01-18 DIAGNOSIS — R0609 Other forms of dyspnea: Secondary | ICD-10-CM | POA: Diagnosis not present

## 2020-02-25 ENCOUNTER — Ambulatory Visit: Payer: Medicare HMO | Admitting: Cardiology

## 2020-02-25 ENCOUNTER — Encounter: Payer: Self-pay | Admitting: Cardiology

## 2020-02-25 ENCOUNTER — Other Ambulatory Visit: Payer: Self-pay

## 2020-02-25 VITALS — BP 150/90 | HR 94 | Ht 73.0 in | Wt 174.0 lb

## 2020-02-25 DIAGNOSIS — I10 Essential (primary) hypertension: Secondary | ICD-10-CM | POA: Diagnosis not present

## 2020-02-25 DIAGNOSIS — R002 Palpitations: Secondary | ICD-10-CM

## 2020-02-25 DIAGNOSIS — I452 Bifascicular block: Secondary | ICD-10-CM | POA: Diagnosis not present

## 2020-02-25 MED ORDER — DILTIAZEM HCL ER COATED BEADS 240 MG PO CP24: 240.0000 mg | ORAL_CAPSULE | Freq: Every evening | ORAL | 1 refills | Status: DC

## 2020-02-25 NOTE — Progress Notes (Signed)
Primary Physician/Referring:  Haywood Pao, MD  Patient ID: Oletha Cruel, male    DOB: February 09, 1941, 79 y.o.   MRN: [id number]  Chief Complaint  Patient presents with  . Follow-up    6 week  . Hypertension  . Abnormal ECG   HPI:    SREEKAR BROYHILL  is a 79 y.o. Caucasian with hypertension, hyperlipidemia, moderate hiatal hernia, hypothyroidism was evaluated in the emergency room on 01/08/2020 when he presented with visual disturbances when he was waking around the house and felt dizzy, palpitations, shortness of breath and dizziness.  He was found to have sinus  tachycardia, spontaneously resolved with intravenous metoprolol, labs within normal limits except for mildly elevated T4, was discharged home and recommended cardiology follow-up. CT heat negative for stroke and MI ruled out.  He was seen by me about 4 to 5 weeks ago, he is now asymptomatic without palpitations, dizziness.  States that he ran out of diltiazem CD and discontinued this few days ago.   Past Medical History:  Diagnosis Date  . Arthritis   . Enlarged prostate   . GERD (gastroesophageal reflux disease)    Takes Prilosec  . Hyperlipidemia   . Hypertension    Takes Quinapril  . Hypothyroidism    Takes Synthroid  . Thyroid disease    Past Surgical History:  Procedure Laterality Date  . BIOPSY SHOULDER     Left shoulder biopsy  . CARDIAC CATHETERIZATION     Clean cath; unaware of where he had cath done at  . COLONOSCOPY W/ POLYPECTOMY    . HERNIA REPAIR  10/10/11   Open LIH repair w/mesh  . INGUINAL HERNIA REPAIR  10/10/2011   Procedure: HERNIA REPAIR INGUINAL ADULT;  Surgeon: Madilyn Hook, DO;  Location: [facility];  Service: General;  Laterality: Left;  OPEN LEFT INGUINAL HERNIA REPAIR  . PROSTATE BIOPSY     Family History  Problem Relation Age of Onset  . Stroke Father   . CAD Brother   . CAD Brother   . Anesthesia problems Neg Hx   . Hypotension Neg Hx   . Malignant hyperthermia Neg Hx   .  Pseudochol deficiency Neg Hx   . Colon cancer Neg Hx     Social History   Tobacco Use  . Smoking status: Former Smoker    Quit date: 10/01/1962    Years since quitting: 57.4  . Smokeless tobacco: Former Systems developer    Quit date: 10/01/1962  Substance Use Topics  . Alcohol use: Yes    Alcohol/week: 2.0 standard drinks    Types: 2 Cans of beer per week    Comment: daily   Marital Status: Married  ROS  Review of Systems  Cardiovascular: Negative for dyspnea on exertion, leg swelling and syncope.  Gastrointestinal: Negative for melena.   Objective  Blood pressure (!) 150/90, pulse 94, height 6\' 1"  (1.854 m), weight 174 lb (78.9 kg).  Vitals with BMI 02/25/2020 01/13/2020 01/13/2020  Height 6\' 1"  - 6\' 1"   Weight 174 lbs - 175 lbs  BMI 45.80 - 99.83  Systolic 382 505 397  Diastolic 90 89 92  Pulse 94 91 -     Physical Exam Constitutional:      General: He is not in acute distress.    Appearance: He is well-developed.  Cardiovascular:     Rate and Rhythm: Normal rate and regular rhythm.     Pulses: Intact distal pulses.     Heart sounds: No murmur heard.  No gallop.      Comments: No leg edema, no JVD.  Pulmonary:     Effort: Pulmonary effort is normal. No accessory muscle usage.     Breath sounds: Normal breath sounds.  Abdominal:     General: Bowel sounds are normal.     Palpations: Abdomen is soft.    Laboratory examination:   Recent Labs    01/08/20 0900  NA 138  K 3.7  CL 103  CO2 25  GLUCOSE 129*  BUN 13  CREATININE 1.19  CALCIUM 9.8  GFRNONAA 57*  GFRAA >60   CrCl cannot be calculated (Patient's most recent lab result is older than the maximum 21 days allowed.).  CMP Latest Ref Rng & Units 01/08/2020 01/12/2015 12/13/2014  Glucose 70 - 99 mg/dL 129(H) 103(H) 116(H)  BUN 8 - 23 mg/dL 13 5(L) 10  Creatinine 0.61 - 1.24 mg/dL 1.19 0.96 1.07  Sodium 135 - 145 mmol/L 138 136 138  Potassium 3.5 - 5.1 mmol/L 3.7 3.6 3.6  Chloride 98 - 111 mmol/L 103 104 104  CO2 22  - 32 mmol/L 25 24 24   Calcium 8.9 - 10.3 mg/dL 9.8 9.2 9.5  Total Protein - - - -  Total Bilirubin - - - -  Alkaline Phos - - - -  AST - - - -  ALT - - - -   CBC Latest Ref Rng & Units 01/08/2020 01/12/2015 12/13/2014  WBC 4.0 - 10.5 K/uL 4.8 7.3 3.2(L)  Hemoglobin 13.0 - 17.0 g/dL 14.4 14.0 15.1  Hematocrit 39 - 52 % 43.4 40.9 44.7  Platelets 150 - 400 K/uL 253 244 227       Component Value Date/Time   CHOL 132 12/14/2014 0332   TRIG 88 12/14/2014 0332   HDL 50 12/14/2014 0332   CHOLHDL 2.6 12/14/2014 0332   VLDL 18 12/14/2014 0332   LDLCALC 64 12/14/2014 0332    HEMOGLOBIN A1C No results found for: HGBA1C, MPG TSH Recent Labs    01/08/20 0933  TSH 0.570    External labs:  Glucose Random 129.000 01/08/2020  BUN 13.000 01/08/2020 Creatinine, Serum 1.190 01/08/2020  TSH 0.570 01/08/2020  PSA 4.600 ng/ 06/04/2019  HDL 65 MG/DL 06/03/2019 LDL 70.000 mg 06/03/2019 Cholesterol, total 144.000 m 06/03/2019 Triglycerides 45.000 06/03/2019  Medications and allergies  No Known Allergies   Current Outpatient Medications  Medication Instructions  . atorvastatin (LIPITOR) 10 mg, Daily  . diltiazem (CARDIZEM CD) 240 mg, Oral, Every evening  . levothyroxine (SYNTHROID) 150 mcg, Oral, Daily before breakfast  . Multiple Vitamins-Minerals (MULTIVITAMINS THER. W/MINERALS) TABS 1 tablet, Daily  . olmesartan-hydrochlorothiazide (BENICAR HCT) 40-12.5 MG tablet 1 tablet, Oral, BH-each morning  . omeprazole (PRILOSEC) 20 mg, Oral, 2 times daily before meals  . tamsulosin (FLOMAX) 0.4 mg, Oral, 2 times daily   Medications Discontinued During This Encounter  Medication Reason  . diltiazem (CARDIZEM CD) 180 MG 24 hr capsule Reorder    Radiology:   CT Angio Abd/Pel 12/13/2014:  No evidence of aortic dissection or aneurysm. No evidence of pulmonary embolism. Moderate-sized hiatal hernia. Significantly enlarged prostate gland with chronic bladder outlet obstruction. RIGHT inguinal  hernia containing fat. Minimal sigmoid diverticulosis.  Cardiac Studies:   Coronary angiogram 10/15/2004: Normal coronary arteries, right dominant circulation.   Echocardiogram 01/18/2020:  Left ventricle cavity is normal in size. Mild concentric hypertrophy of the left ventricle. Normal global wall motion. Normal LV systolic function  with EF 60%. Doppler evidence of grade I (impaired) diastolic dysfunction,  normal LAP.  Left atrial cavity is moderately dilated at 47 cc/m2. Aneurysmal interatrial septum without 2D or color Doppler evidence of interatrial shunt.  Moderate (Grade II) mitral regurgitation.  Mild tricuspid regurgitation. Estimated pulmonary artery systolic pressure is 27 mmHg.  No significant change from 12/13/2014.  Event Monitor for 14 days Start date 01/18/2020 - 01/31/2020 :  Baseline sample showed Sinus Rhythm w/Interpolated PVC/PVCs (16 in 1 min) with a heart rate of 68 bpm.  There were no patient triggered events.  There was no heart block, no atrial fibrillation.  PVC burden was 5 percent.  Rare PACs, burden <1 percent.  EKG  EKG 01/15/2020: Normal sinus rhythm with rate of 84 bpm, left axis deviation, left anterior fascicular block.  Right bundle branch block.  Bifascicular block. Anterolateral infarct old.  LVH with repolarization abnormality, cannot exclude high lateral ischemia.  Abnormal EKG.  PVC (3).    EKG 01/08/2020: Sinus tachycardia at rate of 114 bpm, left axis deviation, left anterior fascicular block.  Right bundle branch block.  Poor R wave progression, cannot exclude lateral infarct old.  LVH with repolarization abnormality, cannot exclude lateral ischemia.  PVC.  Assessment     ICD-10-CM   1. Palpitations  R00.2 DISCONTINUED: diltiazem (CARDIZEM CD) 240 MG 24 hr capsule  2. Bifascicular block  I45.2   3. Primary hypertension  I10 DISCONTINUED: diltiazem (CARDIZEM CD) 240 MG 24 hr capsule     Recommendations:   ISAIC SYLER  is a 79 y.o.  Caucasian with hypertension, hyperlipidemia, moderate hiatal hernia, hypothyroidism was evaluated in the emergency room on 01/08/2020 when he presented with palpitations, shortness of breath and dizziness.  He was found to have sinus tachycardia, spontaneously resolved with intravenous metoprolol, labs within normal limits except for mildly elevated T4, was discharged home and recommended cardiology follow-up.  I had seen him 6 weeks ago.  He now presents for follow-up, states that symptoms have essentially resolved and he has not had any episodes of palpitations.  He has discontinued diltiazem as he did not have any refills.  His blood pressure is markedly elevated today, I will increase the diltiazem dose from 180 mg to 240 mg daily and prescription has been sent.  As his blood pressure is markedly elevated, he also has underlying bifascicular block, I have recommended that he make an appointment to see Dr. Osborne Casco in 3-4 weeks for follow-up of hypertension and also probably repeat EKG to exclude any high degree AV block in view of his age as a risk factor for complete heart block.  He prefers to do this instead of following up with me.  Otherwise he remained stable, echocardiogram reveals no significant valvular abnormality, I will see him back on a as needed basis.   Adrian Prows, MD, [facility] 02/27/2020, 2:47 PM Office: [phone]

## 2020-04-28 ENCOUNTER — Other Ambulatory Visit: Payer: Self-pay

## 2020-04-28 ENCOUNTER — Telehealth: Payer: Self-pay

## 2020-04-28 DIAGNOSIS — I10 Essential (primary) hypertension: Secondary | ICD-10-CM

## 2020-04-28 MED ORDER — OLMESARTAN MEDOXOMIL-HCTZ 40-12.5 MG PO TABS: 1.0000 | ORAL_TABLET | ORAL | 2 refills | Status: DC

## 2020-04-28 NOTE — Telephone Encounter (Signed)
Medication sent to pharmacy on file.  

## 2020-04-29 ENCOUNTER — Other Ambulatory Visit: Payer: Self-pay | Admitting: Cardiology

## 2020-04-29 DIAGNOSIS — I10 Essential (primary) hypertension: Secondary | ICD-10-CM

## 2020-05-06 DIAGNOSIS — Z23 Encounter for immunization: Secondary | ICD-10-CM | POA: Diagnosis not present

## 2020-06-07 DIAGNOSIS — Z125 Encounter for screening for malignant neoplasm of prostate: Secondary | ICD-10-CM | POA: Diagnosis not present

## 2020-06-07 DIAGNOSIS — E7800 Pure hypercholesterolemia, unspecified: Secondary | ICD-10-CM | POA: Diagnosis not present

## 2020-06-07 DIAGNOSIS — E039 Hypothyroidism, unspecified: Secondary | ICD-10-CM | POA: Diagnosis not present

## 2020-06-14 DIAGNOSIS — R972 Elevated prostate specific antigen [PSA]: Secondary | ICD-10-CM | POA: Diagnosis not present

## 2020-06-14 DIAGNOSIS — E7800 Pure hypercholesterolemia, unspecified: Secondary | ICD-10-CM | POA: Diagnosis not present

## 2020-06-14 DIAGNOSIS — Z0000 Encounter for general adult medical examination without abnormal findings: Secondary | ICD-10-CM | POA: Diagnosis not present

## 2020-06-14 DIAGNOSIS — D72819 Decreased white blood cell count, unspecified: Secondary | ICD-10-CM | POA: Diagnosis not present

## 2020-06-14 DIAGNOSIS — R82998 Other abnormal findings in urine: Secondary | ICD-10-CM | POA: Diagnosis not present

## 2020-06-14 DIAGNOSIS — N401 Enlarged prostate with lower urinary tract symptoms: Secondary | ICD-10-CM | POA: Diagnosis not present

## 2020-06-14 DIAGNOSIS — R69 Illness, unspecified: Secondary | ICD-10-CM | POA: Diagnosis not present

## 2020-06-14 DIAGNOSIS — I872 Venous insufficiency (chronic) (peripheral): Secondary | ICD-10-CM | POA: Diagnosis not present

## 2020-06-14 DIAGNOSIS — I10 Essential (primary) hypertension: Secondary | ICD-10-CM | POA: Diagnosis not present

## 2020-06-14 DIAGNOSIS — K4090 Unilateral inguinal hernia, without obstruction or gangrene, not specified as recurrent: Secondary | ICD-10-CM | POA: Diagnosis not present

## 2020-06-14 DIAGNOSIS — E039 Hypothyroidism, unspecified: Secondary | ICD-10-CM | POA: Diagnosis not present

## 2020-12-18 DIAGNOSIS — Z1331 Encounter for screening for depression: Secondary | ICD-10-CM | POA: Diagnosis not present

## 2020-12-18 DIAGNOSIS — I493 Ventricular premature depolarization: Secondary | ICD-10-CM | POA: Diagnosis not present

## 2020-12-18 DIAGNOSIS — D692 Other nonthrombocytopenic purpura: Secondary | ICD-10-CM | POA: Diagnosis not present

## 2020-12-18 DIAGNOSIS — Z1339 Encounter for screening examination for other mental health and behavioral disorders: Secondary | ICD-10-CM | POA: Diagnosis not present

## 2020-12-18 DIAGNOSIS — I872 Venous insufficiency (chronic) (peripheral): Secondary | ICD-10-CM | POA: Diagnosis not present

## 2020-12-18 DIAGNOSIS — M170 Bilateral primary osteoarthritis of knee: Secondary | ICD-10-CM | POA: Diagnosis not present

## 2020-12-18 DIAGNOSIS — N401 Enlarged prostate with lower urinary tract symptoms: Secondary | ICD-10-CM | POA: Diagnosis not present

## 2020-12-18 DIAGNOSIS — E039 Hypothyroidism, unspecified: Secondary | ICD-10-CM | POA: Diagnosis not present

## 2020-12-18 DIAGNOSIS — I10 Essential (primary) hypertension: Secondary | ICD-10-CM | POA: Diagnosis not present

## 2020-12-18 DIAGNOSIS — E7800 Pure hypercholesterolemia, unspecified: Secondary | ICD-10-CM | POA: Diagnosis not present

## 2020-12-18 DIAGNOSIS — R69 Illness, unspecified: Secondary | ICD-10-CM | POA: Diagnosis not present

## 2021-06-03 ENCOUNTER — Emergency Department (HOSPITAL_COMMUNITY)
Admission: EM | Admit: 2021-06-03 | Discharge: 2021-06-04 | Disposition: A | Discharge status: Home / Self Care | Payer: Medicare HMO | Attending: Emergency Medicine | Admitting: Emergency Medicine

## 2021-06-03 ENCOUNTER — Other Ambulatory Visit: Payer: Self-pay

## 2021-06-03 ENCOUNTER — Emergency Department (HOSPITAL_COMMUNITY): Payer: Medicare HMO

## 2021-06-03 ENCOUNTER — Encounter (HOSPITAL_COMMUNITY): Payer: Self-pay | Admitting: Emergency Medicine

## 2021-06-03 DIAGNOSIS — Z79899 Other long term (current) drug therapy: Secondary | ICD-10-CM | POA: Insufficient documentation

## 2021-06-03 DIAGNOSIS — R0689 Other abnormalities of breathing: Secondary | ICD-10-CM | POA: Insufficient documentation

## 2021-06-03 DIAGNOSIS — J101 Influenza due to other identified influenza virus with other respiratory manifestations: Secondary | ICD-10-CM | POA: Insufficient documentation

## 2021-06-03 DIAGNOSIS — R0989 Other specified symptoms and signs involving the circulatory and respiratory systems: Secondary | ICD-10-CM | POA: Diagnosis not present

## 2021-06-03 DIAGNOSIS — J9811 Atelectasis: Secondary | ICD-10-CM | POA: Diagnosis not present

## 2021-06-03 DIAGNOSIS — I10 Essential (primary) hypertension: Secondary | ICD-10-CM | POA: Diagnosis not present

## 2021-06-03 DIAGNOSIS — R059 Cough, unspecified: Secondary | ICD-10-CM | POA: Diagnosis not present

## 2021-06-03 DIAGNOSIS — R0600 Dyspnea, unspecified: Secondary | ICD-10-CM | POA: Diagnosis not present

## 2021-06-03 DIAGNOSIS — Z20822 Contact with and (suspected) exposure to covid-19: Secondary | ICD-10-CM | POA: Diagnosis not present

## 2021-06-03 DIAGNOSIS — E039 Hypothyroidism, unspecified: Secondary | ICD-10-CM | POA: Insufficient documentation

## 2021-06-03 DIAGNOSIS — J111 Influenza due to unidentified influenza virus with other respiratory manifestations: Secondary | ICD-10-CM | POA: Diagnosis not present

## 2021-06-03 DIAGNOSIS — K449 Diaphragmatic hernia without obstruction or gangrene: Secondary | ICD-10-CM | POA: Diagnosis not present

## 2021-06-03 DIAGNOSIS — Z87891 Personal history of nicotine dependence: Secondary | ICD-10-CM | POA: Diagnosis not present

## 2021-06-03 DIAGNOSIS — R9431 Abnormal electrocardiogram [ECG] [EKG]: Secondary | ICD-10-CM | POA: Diagnosis not present

## 2021-06-03 LAB — RESP PANEL BY RT-PCR (FLU A&B, COVID) ARPGX2
Influenza A by PCR: POSITIVE — AB
Influenza B by PCR: NEGATIVE
SARS Coronavirus 2 by RT PCR: NEGATIVE

## 2021-06-03 LAB — COMPREHENSIVE METABOLIC PANEL
ALT: 20 U/L (ref 0–44)
AST: 26 U/L (ref 15–41)
Albumin: 3.3 g/dL — ABNORMAL LOW (ref 3.5–5.0)
Alkaline Phosphatase: 66 U/L (ref 38–126)
Anion gap: 8 (ref 5–15)
BUN: 9 mg/dL (ref 8–23)
CO2: 23 mmol/L (ref 22–32)
Calcium: 9.2 mg/dL (ref 8.9–10.3)
Chloride: 100 mmol/L (ref 98–111)
Creatinine, Ser: 1.21 mg/dL (ref 0.61–1.24)
GFR, Estimated: 60 mL/min — ABNORMAL LOW (ref >60)
Glucose, Bld: 113 mg/dL — ABNORMAL HIGH (ref 70–99)
Potassium: 3.8 mmol/L (ref 3.5–5.1)
Sodium: 131 mmol/L — ABNORMAL LOW (ref 135–145)
Total Bilirubin: 1 mg/dL (ref 0.3–1.2)
Total Protein: 6.1 g/dL — ABNORMAL LOW (ref 6.5–8.1)

## 2021-06-03 LAB — CBC WITH DIFFERENTIAL/PLATELET
Abs Immature Granulocytes: 0.02 10*3/uL (ref 0.00–0.07)
Basophils Absolute: 0 10*3/uL (ref 0.0–0.1)
Basophils Relative: 0 %
Eosinophils Absolute: 0 10*3/uL (ref 0.0–0.5)
Eosinophils Relative: 0 %
HCT: 39.7 % (ref 39.0–52.0)
Hemoglobin: 13.5 g/dL (ref 13.0–17.0)
Immature Granulocytes: 0 %
Lymphocytes Relative: 9 %
Lymphs Abs: 0.7 10*3/uL (ref 0.7–4.0)
MCH: 31.5 pg (ref 26.0–34.0)
MCHC: 34 g/dL (ref 30.0–36.0)
MCV: 92.8 fL (ref 80.0–100.0)
Monocytes Absolute: 0.3 10*3/uL (ref 0.1–1.0)
Monocytes Relative: 4 %
Neutro Abs: 6.4 10*3/uL (ref 1.7–7.7)
Neutrophils Relative %: 87 %
Platelets: 178 10*3/uL (ref 150–400)
RBC: 4.28 MIL/uL (ref 4.22–5.81)
RDW: 12.4 % (ref 11.5–15.5)
WBC: 7.4 10*3/uL (ref 4.0–10.5)
nRBC: 0 % (ref 0.0–0.2)

## 2021-06-03 LAB — TROPONIN I (HIGH SENSITIVITY): Troponin I (High Sensitivity): 9 ng/L (ref <18)

## 2021-06-03 LAB — BRAIN NATRIURETIC PEPTIDE: B Natriuretic Peptide: 26.9 pg/mL (ref 0.0–100.0)

## 2021-06-03 NOTE — ED Provider Notes (Signed)
Emergency Medicine Provider Triage Evaluation Note  Hunter Key , a 80 y.o. male  was evaluated in triage.  Pt complains of cough and body aches.  He reports that he heard his chest rattling when he breathed.  He reports that PTA his temp was 101-102, had been normal since. No smoking.  No pulmonary history.  No chest pain.   Review of Systems  Positive: Rhonchi, cough, body aches, leg swelling, short of breath Negative: Chest pain  Physical Exam  BP (!) 157/79 (BP Location: Right Arm)   Pulse 93   Resp 16   SpO2 98%  Gen:   Awake, no distress   Resp:  Normal effort Diffuse rhonchi bilaterally.  MSK:   Moves extremities without difficulty  Other:  Normal speech  Medical Decision Making  Medically screening exam initiated at 6:33 PM.  Appropriate orders placed.  Oletha Cruel was informed that the remainder of the evaluation will be completed by another provider, this initial triage assessment does not replace that evaluation, and the importance of remaining in the ED until their evaluation is complete.  Note: Portions of this report may have been transcribed using voice recognition software. Every effort was made to ensure accuracy; however, inadvertent computerized transcription errors may be present    Ollen Gross 06/03/21 1839    Tegeler, Gwenyth Allegra, MD 06/03/21 2056

## 2021-06-03 NOTE — ED Triage Notes (Signed)
Pt reports non-productive cough and generalized body aches x 1 week.  Denies chest pain and SOB but states his chest was "rattling" when he breathed last night in the bed.

## 2021-06-04 MED ORDER — OSELTAMIVIR PHOSPHATE 75 MG PO CAPS: 75.0000 mg | ORAL_CAPSULE | Freq: Two times a day (BID) | ORAL | 0 refills | Status: DC

## 2021-06-04 MED ORDER — GUAIFENESIN ER 600 MG PO TB12: 600.0000 mg | ORAL_TABLET | Freq: Two times a day (BID) | ORAL | 0 refills | Status: DC

## 2021-06-04 NOTE — ED Provider Notes (Signed)
[facility] EMERGENCY DEPARTMENT Provider Note   CSN: [id number] Arrival date & time: 06/03/21  1759     History Chief Complaint  Patient presents with   Cough   Generalized Body Aches    GAY RAPE is a 80 y.o. male.  HPI  80 year old male with past medical history of HTN, HLD presents to the emergency department with concern for body aches, intermittently productive cough and fever.  Patient states the fatigue and generalized body ache started about a week ago.  Over the past 2 days has been having an increasingly productive cough and fever.  Denies any GI symptoms.  No active chest pain or swelling of his lower extremities.  Has been taking over-the-counter medication at home without any significant relief.  Has concerned that he has developed a pneumonia.  Past Medical History:  Diagnosis Date   Arthritis    Enlarged prostate    GERD (gastroesophageal reflux disease)    Takes Prilosec   Hyperlipidemia    Hypertension    Takes Quinapril   Hypothyroidism    Takes Synthroid   Thyroid disease     Patient Active Problem List   Diagnosis Date Noted   Chest pressure 12/13/2014   Chest pain 12/13/2014   Chest discomfort    COLONIC POLYPS, ADENOMATOUS 08/29/2007   HYPOTHYROIDISM 08/29/2007   HYPERCHOLESTEROLEMIA 08/29/2007   Essential hypertension 08/29/2007   HEMORRHOIDS, INTERNAL 08/29/2007   GERD 08/29/2007   Hiatal hernia 08/29/2007   DIVERTICULOSIS, COLON 11/17/2001    Past Surgical History:  Procedure Laterality Date   BIOPSY SHOULDER     Left shoulder biopsy   CARDIAC CATHETERIZATION     Clean cath; unaware of where he had cath done at   [place]  10/10/11   Open [facility] repair w/mesh   INGUINAL HERNIA REPAIR  10/10/2011   Procedure: HERNIA REPAIR INGUINAL ADULT;  Surgeon: Madilyn Hook, DO;  Location: [place];  Service: General;  Laterality: Left;  OPEN LEFT INGUINAL HERNIA REPAIR   PROSTATE BIOPSY          Family History  Problem Relation Age of Onset   Stroke Father    CAD Brother    CAD Brother    Anesthesia problems Neg Hx    Hypotension Neg Hx    Malignant hyperthermia Neg Hx    Pseudochol deficiency Neg Hx    Colon cancer Neg Hx     Social History   Tobacco Use   Smoking status: Former    Types: Cigarettes    Quit date: 10/01/1962    Years since quitting: 58.7   Smokeless tobacco: Former    Quit date: 10/01/1962  Substance Use Topics   Alcohol use: Yes    Alcohol/week: 2.0 standard drinks    Types: 2 Cans of beer per week    Comment: daily   Drug use: No    Home Medications Prior to Admission medications   Medication Sig Start Date End Date Taking? Authorizing Provider  guaiFENesin (MUCINEX) 600 MG 12 hr tablet Take 1 tablet (600 mg total) by mouth 2 (two) times daily. 06/04/21  Yes Leilani Cespedes, Alvin Critchley, DO  oseltamivir (TAMIFLU) 75 MG capsule Take 1 capsule (75 mg total) by mouth every 12 (twelve) hours. 06/04/21  Yes Read Bonelli, Alvin Critchley, DO  atorvastatin (LIPITOR) 10 MG tablet Take 10 mg by mouth daily.  08/20/11   [provider]  diltiazem (CARDIZEM CD) 240 MG 24 hr  capsule Take 1 capsule (240 mg total) by mouth every evening. 02/25/20 08/23/20  Adrian Prows, MD  levothyroxine (SYNTHROID, LEVOTHROID) 150 MCG tablet Take 150 mcg by mouth daily before breakfast.    [provider]  Multiple Vitamins-Minerals (MULTIVITAMINS THER. W/MINERALS) TABS Take 1 tablet by mouth daily.    [provider]  olmesartan-hydrochlorothiazide (BENICAR HCT) 40-12.5 MG tablet TAKE 1 TABLET BY MOUTH ONCE DAILY IN THE MORNING. DISCONTINUE QUINAPRIL 05/01/20   Adrian Prows, MD  omeprazole (PRILOSEC) 20 MG capsule Take 1 capsule (20 mg total) by mouth 2 (two) times daily before a meal. 12/14/14   Verlee Monte, MD  Tamsulosin HCl (FLOMAX) 0.4 MG CAPS Take 0.4 mg by mouth 2 (two) times daily.  10/29/11   [provider]    Allergies    Patient has no known  allergies.  Review of Systems   Review of Systems  Constitutional:  Positive for fatigue and fever. Negative for chills.  HENT:  Negative for congestion and sore throat.   Eyes:  Negative for visual disturbance.  Respiratory:  Positive for cough. Negative for chest tightness, shortness of breath and wheezing.   Cardiovascular:  Negative for chest pain, palpitations and leg swelling.  Gastrointestinal:  Negative for abdominal pain, diarrhea and vomiting.  Genitourinary:  Negative for dysuria.  Musculoskeletal:  Positive for myalgias.  Skin:  Negative for rash.  Neurological:  Negative for headaches.   Physical Exam Updated Vital Signs BP (!) 146/92   Pulse 84   Temp 98.1 F (36.7 C) (Oral)   Resp 18   SpO2 96%   Physical Exam Vitals and nursing note reviewed.  Constitutional:      General: He is not in acute distress.    Appearance: Normal appearance. He is not diaphoretic.  HENT:     Head: Normocephalic.     Mouth/Throat:     Mouth: Mucous membranes are moist.  Cardiovascular:     Rate and Rhythm: Normal rate.  Pulmonary:     Effort: Pulmonary effort is normal. No respiratory distress.     Breath sounds: No wheezing or rales.  Abdominal:     Palpations: Abdomen is soft.     Tenderness: There is no abdominal tenderness.  Musculoskeletal:        General: No swelling.  Skin:    General: Skin is warm.  Neurological:     Mental Status: He is alert and oriented to person, place, and time. Mental status is at baseline.  Psychiatric:        Mood and Affect: Mood normal.    ED Results / Procedures / Treatments   Labs (all labs ordered are listed, but only abnormal results are displayed) Labs Reviewed  RESP PANEL BY RT-PCR (FLU A&B, COVID) ARPGX2 - Abnormal; Notable for the following components:      Result Value   Influenza A by PCR POSITIVE (*)    All other components within normal limits  COMPREHENSIVE METABOLIC PANEL - Abnormal; Notable for the following  components:   Sodium 131 (*)    Glucose, Bld 113 (*)    Total Protein 6.1 (*)    Albumin 3.3 (*)    GFR, Estimated 60 (*)    All other components within normal limits  CBC WITH DIFFERENTIAL/PLATELET  BRAIN NATRIURETIC PEPTIDE  TROPONIN I (HIGH SENSITIVITY)  TROPONIN I (HIGH SENSITIVITY)    EKG EKG Interpretation  Date/Time:  Sunday June 03 2021 18:47:52 EDT Ventricular Rate:  86 PR Interval:  148  QRS Duration: 160 QT Interval:  394 QTC Calculation: 471 R Axis:   -55 Text Interpretation: Normal sinus rhythm Right bundle branch block Left anterior fascicular block Bifascicular block Minimal voltage criteria for LVH, may be normal variant ( R in aVL ) Septal infarct , age undetermined Abnormal ECG Similar to previous Confirmed by Lavenia Atlas [phone]) on 06/04/2021 8:21:11 AM  Radiology DG Chest 2 View  Result Date: 06/03/2021 CLINICAL DATA:  Dyspnea, rhonchi EXAM: CHEST - 2 VIEW COMPARISON:  12/13/2014 FINDINGS: Mild left basilar atelectasis. Lungs are otherwise clear. No pneumothorax or pleural effusion. Moderate hiatal hernia. Cardiac size within normal limits. Pulmonary vascularity is normal. Midthoracic compression fracture is unchanged from prior CT examination of 12/13/2014. Degenerative changes are seen within the thoracic spine. IMPRESSION: No active cardiopulmonary disease. Moderate hiatal hernia. Electronically Signed   By: Fidela Salisbury M.D.   On: 06/03/2021 19:16    Procedures Procedures   Medications Ordered in ED Medications - No data to display  ED Course  I have reviewed the triage vital signs and the nursing notes.  Pertinent labs & imaging results that were available during my care of the patient were reviewed by me and considered in my medical decision making (see chart for details).    MDM Rules/Calculators/A&P                           80 year old male presents emergency department with fatigue, myalgias, productive cough and fever.  Vitals are  stable on arrival.  Patient appears tired but nontoxic.  Lung sounds are clear.  EKG is similar to previous.  Blood work shows a mild hyponatremia but is otherwise normal.  Cardiac work-up was normal.  Chest x-ray shows no focal pneumonia, shows a known hiatal hernia.  Patient is high risk with multiple comorbidities but otherwise appears well, is able to p.o.  Plan to treat with Tamiflu and over-the-counter medications.  Patient at this time appears safe and stable for discharge and will be treated as an outpatient.  Discharge plan and strict return to ED precautions discussed, patient verbalizes understanding and agreement.  Final Clinical Impression(s) / ED Diagnoses Final diagnoses:  Influenza    Rx / DC Orders ED Discharge Orders          Ordered    oseltamivir (TAMIFLU) 75 MG capsule  Every 12 hours        06/04/21 0905    guaiFENesin (MUCINEX) 600 MG 12 hr tablet  2 times daily        06/04/21 0905             Lorelle Gibbs, DO 06/04/21 [id number]

## 2021-06-04 NOTE — ED Notes (Signed)
Pt verbalizes understanding of discharge instructions. Opportunity for questions and answers were provided. Pt discharged from the ED.   ?

## 2021-06-04 NOTE — Discharge Instructions (Addendum)
You have been seen and discharged from the emergency department.  You are flu positive.  Your chest x-ray, blood work and heart work-up was otherwise normal.  Take Tamiflu as prescribed. Take other prescriptions as directed.  Stay well-hydrated, you may take Tylenol/ibuprofen as needed for fever/pain control.  Follow-up with your primary provider for reevaluation and further care. Take home medications as prescribed. If you have any worsening symptoms or further concerns for your health please return to an emergency department for further evaluation.

## 2021-06-19 DIAGNOSIS — I10 Essential (primary) hypertension: Secondary | ICD-10-CM | POA: Diagnosis not present

## 2021-06-19 DIAGNOSIS — E7800 Pure hypercholesterolemia, unspecified: Secondary | ICD-10-CM | POA: Diagnosis not present

## 2021-06-19 DIAGNOSIS — Z125 Encounter for screening for malignant neoplasm of prostate: Secondary | ICD-10-CM | POA: Diagnosis not present

## 2021-06-19 DIAGNOSIS — R972 Elevated prostate specific antigen [PSA]: Secondary | ICD-10-CM | POA: Diagnosis not present

## 2021-06-19 DIAGNOSIS — E039 Hypothyroidism, unspecified: Secondary | ICD-10-CM | POA: Diagnosis not present

## 2021-06-26 DIAGNOSIS — R972 Elevated prostate specific antigen [PSA]: Secondary | ICD-10-CM | POA: Diagnosis not present

## 2021-06-26 DIAGNOSIS — I10 Essential (primary) hypertension: Secondary | ICD-10-CM | POA: Diagnosis not present

## 2021-06-26 DIAGNOSIS — M170 Bilateral primary osteoarthritis of knee: Secondary | ICD-10-CM | POA: Diagnosis not present

## 2021-06-26 DIAGNOSIS — I493 Ventricular premature depolarization: Secondary | ICD-10-CM | POA: Diagnosis not present

## 2021-06-26 DIAGNOSIS — E039 Hypothyroidism, unspecified: Secondary | ICD-10-CM | POA: Diagnosis not present

## 2021-06-26 DIAGNOSIS — E7800 Pure hypercholesterolemia, unspecified: Secondary | ICD-10-CM | POA: Diagnosis not present

## 2021-06-26 DIAGNOSIS — Z1331 Encounter for screening for depression: Secondary | ICD-10-CM | POA: Diagnosis not present

## 2021-06-26 DIAGNOSIS — R82998 Other abnormal findings in urine: Secondary | ICD-10-CM | POA: Diagnosis not present

## 2021-06-26 DIAGNOSIS — N401 Enlarged prostate with lower urinary tract symptoms: Secondary | ICD-10-CM | POA: Diagnosis not present

## 2021-06-26 DIAGNOSIS — Z0000 Encounter for general adult medical examination without abnormal findings: Secondary | ICD-10-CM | POA: Diagnosis not present

## 2021-06-26 DIAGNOSIS — I872 Venous insufficiency (chronic) (peripheral): Secondary | ICD-10-CM | POA: Diagnosis not present

## 2021-06-26 DIAGNOSIS — Z1339 Encounter for screening examination for other mental health and behavioral disorders: Secondary | ICD-10-CM | POA: Diagnosis not present

## 2021-06-26 DIAGNOSIS — D692 Other nonthrombocytopenic purpura: Secondary | ICD-10-CM | POA: Diagnosis not present

## 2021-11-19 IMAGING — CR DG CHEST 2V
2 series · 2 of 2 positions shown · non-contrast
Comparison: 12/13/2014

CLINICAL DATA: Dyspnea, rhonchi

EXAM:
CHEST - 2 VIEW

[chest pa]
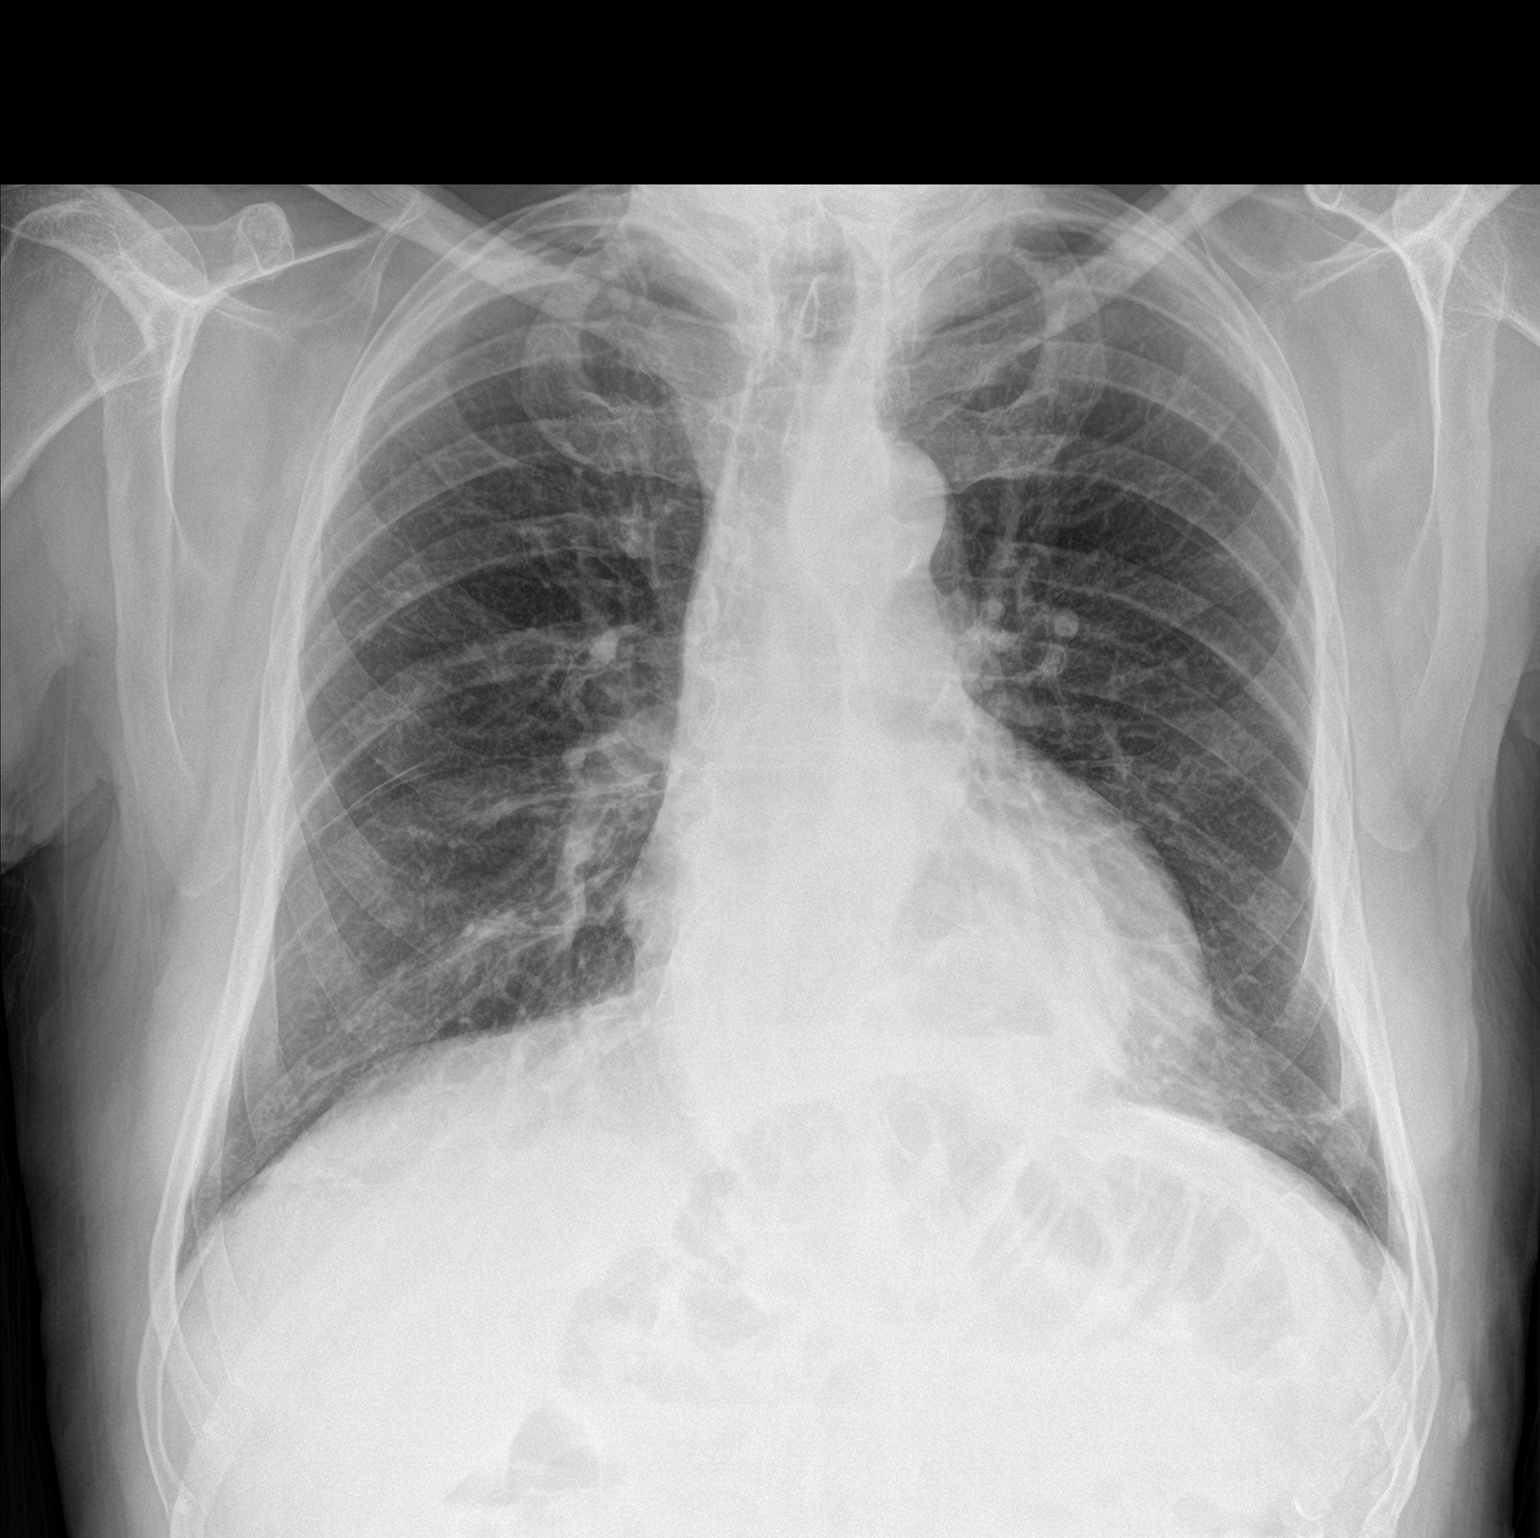

[chest lat]
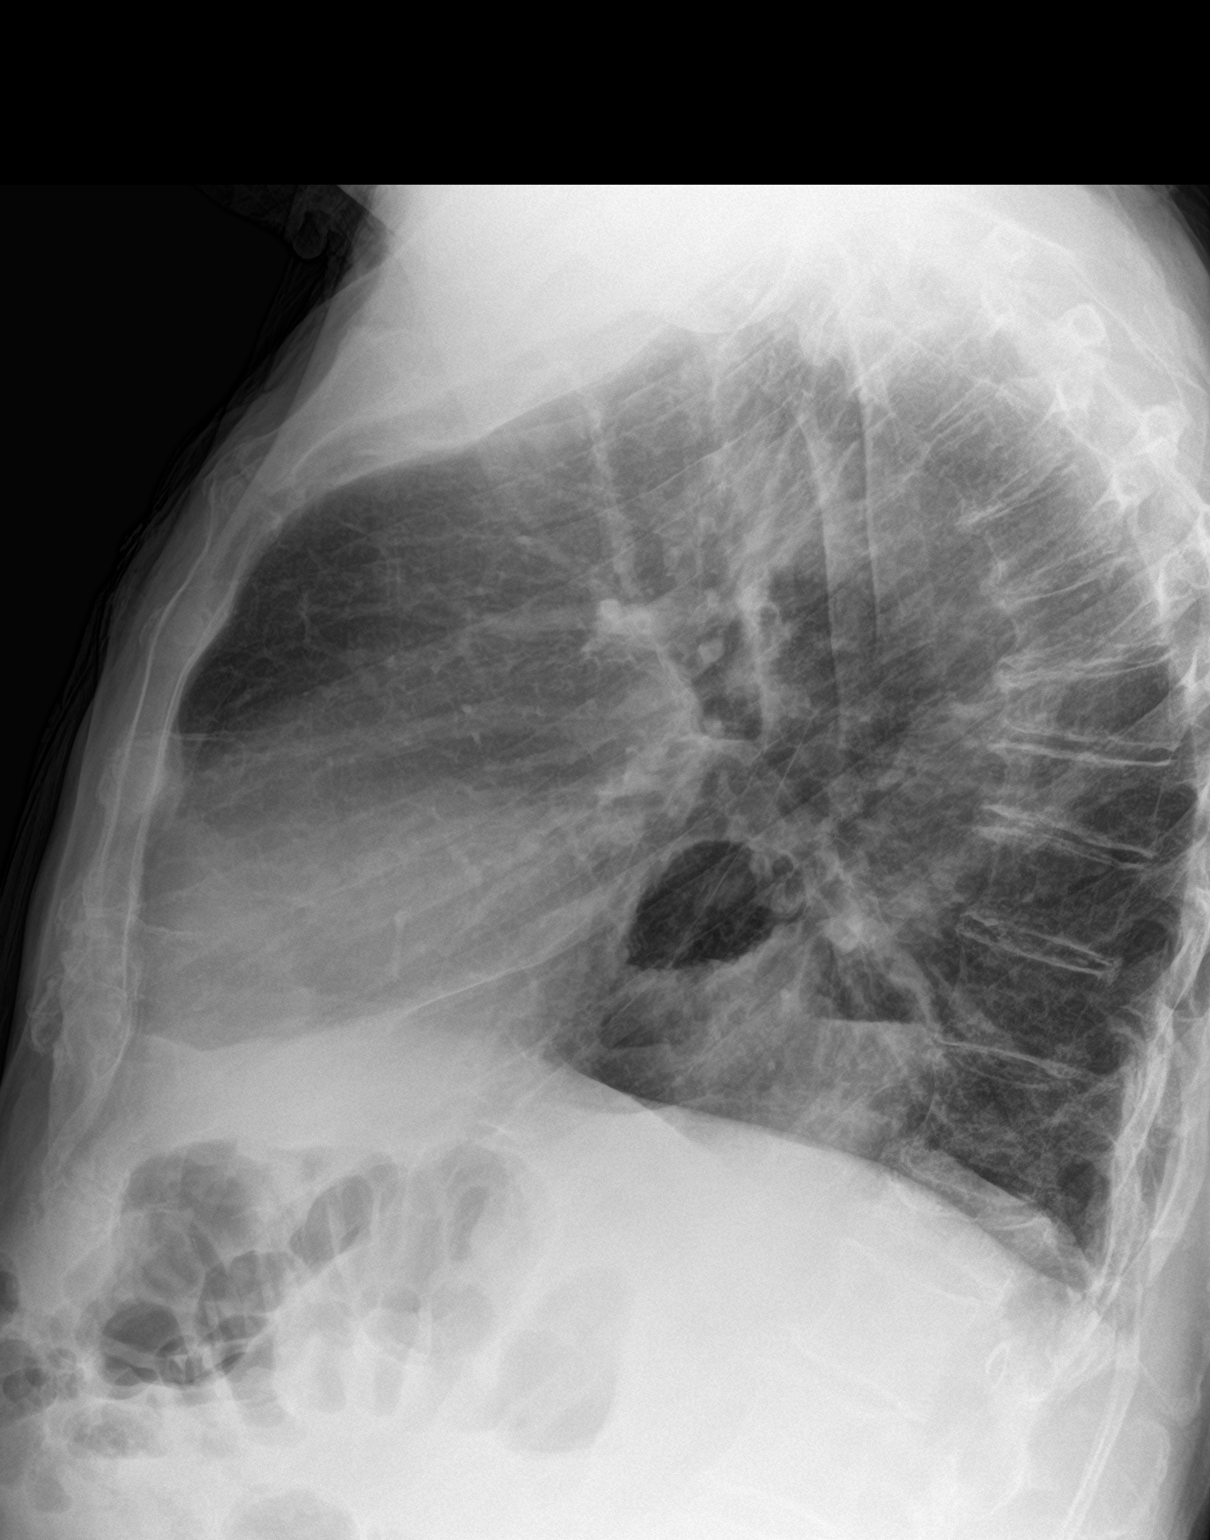

[2 of 2 positions shown; findings below may reference images not displayed]

FINDINGS: Mild left basilar atelectasis. Lungs are otherwise clear. No
pneumothorax or pleural effusion. Moderate hiatal hernia. Cardiac
size within normal limits. Pulmonary vascularity is normal.
Midthoracic compression fracture is unchanged from prior CT
examination of 12/13/2014. Degenerative changes are seen within the
thoracic spine.
IMPRESSION: No active cardiopulmonary disease.

Moderate hiatal hernia.

## 2021-12-25 DIAGNOSIS — I493 Ventricular premature depolarization: Secondary | ICD-10-CM | POA: Diagnosis not present

## 2021-12-25 DIAGNOSIS — D126 Benign neoplasm of colon, unspecified: Secondary | ICD-10-CM | POA: Diagnosis not present

## 2021-12-25 DIAGNOSIS — Z1331 Encounter for screening for depression: Secondary | ICD-10-CM | POA: Diagnosis not present

## 2021-12-25 DIAGNOSIS — M170 Bilateral primary osteoarthritis of knee: Secondary | ICD-10-CM | POA: Diagnosis not present

## 2021-12-25 DIAGNOSIS — E7800 Pure hypercholesterolemia, unspecified: Secondary | ICD-10-CM | POA: Diagnosis not present

## 2021-12-25 DIAGNOSIS — Z1339 Encounter for screening examination for other mental health and behavioral disorders: Secondary | ICD-10-CM | POA: Diagnosis not present

## 2021-12-25 DIAGNOSIS — D692 Other nonthrombocytopenic purpura: Secondary | ICD-10-CM | POA: Diagnosis not present

## 2021-12-25 DIAGNOSIS — I872 Venous insufficiency (chronic) (peripheral): Secondary | ICD-10-CM | POA: Diagnosis not present

## 2021-12-25 DIAGNOSIS — E039 Hypothyroidism, unspecified: Secondary | ICD-10-CM | POA: Diagnosis not present

## 2021-12-25 DIAGNOSIS — I10 Essential (primary) hypertension: Secondary | ICD-10-CM | POA: Diagnosis not present

## 2021-12-25 DIAGNOSIS — R972 Elevated prostate specific antigen [PSA]: Secondary | ICD-10-CM | POA: Diagnosis not present

## 2021-12-25 DIAGNOSIS — K449 Diaphragmatic hernia without obstruction or gangrene: Secondary | ICD-10-CM | POA: Diagnosis not present

## 2022-07-10 DIAGNOSIS — Z125 Encounter for screening for malignant neoplasm of prostate: Secondary | ICD-10-CM | POA: Diagnosis not present

## 2022-07-10 DIAGNOSIS — E7800 Pure hypercholesterolemia, unspecified: Secondary | ICD-10-CM | POA: Diagnosis not present

## 2022-07-10 DIAGNOSIS — R7989 Other specified abnormal findings of blood chemistry: Secondary | ICD-10-CM | POA: Diagnosis not present

## 2022-07-10 DIAGNOSIS — I10 Essential (primary) hypertension: Secondary | ICD-10-CM | POA: Diagnosis not present

## 2022-07-10 DIAGNOSIS — E039 Hypothyroidism, unspecified: Secondary | ICD-10-CM | POA: Diagnosis not present

## 2022-07-17 DIAGNOSIS — R972 Elevated prostate specific antigen [PSA]: Secondary | ICD-10-CM | POA: Diagnosis not present

## 2022-07-17 DIAGNOSIS — M170 Bilateral primary osteoarthritis of knee: Secondary | ICD-10-CM | POA: Diagnosis not present

## 2022-07-17 DIAGNOSIS — Z1331 Encounter for screening for depression: Secondary | ICD-10-CM | POA: Diagnosis not present

## 2022-07-17 DIAGNOSIS — I872 Venous insufficiency (chronic) (peripheral): Secondary | ICD-10-CM | POA: Diagnosis not present

## 2022-07-17 DIAGNOSIS — I493 Ventricular premature depolarization: Secondary | ICD-10-CM | POA: Diagnosis not present

## 2022-07-17 DIAGNOSIS — R82998 Other abnormal findings in urine: Secondary | ICD-10-CM | POA: Diagnosis not present

## 2022-07-17 DIAGNOSIS — I10 Essential (primary) hypertension: Secondary | ICD-10-CM | POA: Diagnosis not present

## 2022-07-17 DIAGNOSIS — D692 Other nonthrombocytopenic purpura: Secondary | ICD-10-CM | POA: Diagnosis not present

## 2022-07-17 DIAGNOSIS — E039 Hypothyroidism, unspecified: Secondary | ICD-10-CM | POA: Diagnosis not present

## 2022-07-17 DIAGNOSIS — Z0000 Encounter for general adult medical examination without abnormal findings: Secondary | ICD-10-CM | POA: Diagnosis not present

## 2022-07-17 DIAGNOSIS — E7800 Pure hypercholesterolemia, unspecified: Secondary | ICD-10-CM | POA: Diagnosis not present

## 2022-07-17 DIAGNOSIS — N401 Enlarged prostate with lower urinary tract symptoms: Secondary | ICD-10-CM | POA: Diagnosis not present

## 2022-07-17 DIAGNOSIS — Z1339 Encounter for screening examination for other mental health and behavioral disorders: Secondary | ICD-10-CM | POA: Diagnosis not present

## 2022-09-10 DIAGNOSIS — I493 Ventricular premature depolarization: Secondary | ICD-10-CM | POA: Diagnosis not present

## 2022-09-10 DIAGNOSIS — E7800 Pure hypercholesterolemia, unspecified: Secondary | ICD-10-CM | POA: Diagnosis not present

## 2022-09-10 DIAGNOSIS — I10 Essential (primary) hypertension: Secondary | ICD-10-CM | POA: Diagnosis not present

## 2022-10-01 ENCOUNTER — Ambulatory Visit: Payer: Medicare HMO | Admitting: Cardiology

## 2022-10-01 ENCOUNTER — Encounter: Payer: Self-pay | Admitting: Cardiology

## 2022-10-01 VITALS — BP 114/70 | HR 83 | Resp 16 | Ht 73.0 in | Wt 177.8 lb

## 2022-10-01 DIAGNOSIS — I4719 Other supraventricular tachycardia: Secondary | ICD-10-CM | POA: Diagnosis not present

## 2022-10-01 DIAGNOSIS — I10 Essential (primary) hypertension: Secondary | ICD-10-CM

## 2022-10-01 DIAGNOSIS — I452 Bifascicular block: Secondary | ICD-10-CM

## 2022-10-01 NOTE — Progress Notes (Signed)
Primary Physician/Referring:  Haywood Pao, MD  Patient ID: Hunter Key, male    DOB: [birth year], 82 y.o.   MRN: [id number]  Chief Complaint  Patient presents with   Palpitations   Hypertension   Follow-up   HPI:    Hunter Key  is a 82 y.o. Caucasian with hypertension, hyperlipidemia, moderate hiatal hernia, hypothyroidism, atrial tachycardia, spontaneously resolved with intravenous metoprolol in 2021, labs within normal limits except for mildly elevated T4, was discharged home.  He was seen by his PCP Dr. Osborne Casco for elevated blood pressure, he had done well on diltiazem CD, as patient complained of elevated blood pressure, he was restarted back on diltiazem CD and is referred back to me for further cardiac evaluation.  Patient remains asymptomatic without palpitations.  States that his blood pressure is now very well-controlled with addition of diltiazem CD.   Past Medical History:  Diagnosis Date   Arthritis    Enlarged prostate    GERD (gastroesophageal reflux disease)    Takes Prilosec   Hyperlipidemia    Hypertension    Takes Quinapril   Hypothyroidism    Takes Synthroid   Thyroid disease    Past Surgical History:  Procedure Laterality Date   BIOPSY SHOULDER     Left shoulder biopsy   CARDIAC CATHETERIZATION     Clean cath; unaware of where he had cath done at   [place]  10/10/11   Open [facility] repair w/mesh   INGUINAL HERNIA REPAIR  10/10/2011   Procedure: HERNIA REPAIR INGUINAL ADULT;  Surgeon: Madilyn Hook, DO;  Location: [facility] OR;  Service: General;  Laterality: Left;  OPEN LEFT INGUINAL HERNIA REPAIR   PROSTATE BIOPSY     Family History  Problem Relation Age of Onset   Stroke Father    CAD Brother    CAD Brother    Anesthesia problems Neg Hx    Hypotension Neg Hx    Malignant hyperthermia Neg Hx    Pseudochol deficiency Neg Hx    Colon cancer Neg Hx     Social History   Tobacco Use   Smoking status:  Former    Packs/day: 0.50    Years: 4.00    Total pack years: 2.00    Types: Cigarettes    Quit date: 10/01/1962    Years since quitting: 60.0   Smokeless tobacco: Former    Quit date: 10/01/1962  Substance Use Topics   Alcohol use: Yes    Alcohol/week: 2.0 standard drinks of alcohol    Types: 2 Cans of beer per week    Comment: daily   Marital Status: Married  ROS  Review of Systems  Cardiovascular:  Negative for dyspnea on exertion, leg swelling and syncope.  Gastrointestinal:  Negative for melena.   Objective  Blood pressure 114/70, pulse 83, resp. rate 16, height '6\' 1"'$  (1.854 m), weight 177 lb 12.8 oz (80.6 kg), SpO2 97 %.     10/01/2022   11:30 AM 06/04/2021    9:00 AM 06/04/2021    8:30 AM  Vitals with BMI  Height '6\' 1"'$     Weight 177 lbs 13 oz    BMI [id number]    Systolic [id number]  Diastolic 70 92 88  Pulse 83  84     Physical Exam Neck:     Vascular: No carotid bruit or JVD.  Cardiovascular:     Rate and Rhythm: Normal rate and regular rhythm.  Pulses: Intact distal pulses.     Heart sounds: Normal heart sounds. No murmur heard.    No gallop.  Pulmonary:     Effort: Pulmonary effort is normal.     Breath sounds: Normal breath sounds.  Abdominal:     General: Bowel sounds are normal.     Palpations: Abdomen is soft.  Musculoskeletal:     Right lower leg: No edema.     Left lower leg: No edema.    Laboratory examination:   External labs:  Labs 09/10/2022:  Hb 14.3/HCT 40.8, platelets 230.  TSH markedly reduced at 0.24.  Free T4 normal at 1.6.  Serum glucose 99 mg, BUN 14, creatinine 1.3, EGFR 52/63.8 mL, potassium 4.4, LFTs normal.  Cholesterol, total 148.000 m 07/10/2022 HDL 64.000 mg 07/10/2022 LDL 74.000 mg 07/10/2022 Triglycerides 48.000 mg 07/10/2022  TSH 0.430 07/10/2022  Medications and allergies  No Known Allergies   Current Outpatient Medications  Medication Instructions   atorvastatin (LIPITOR) 10 mg, Daily   diltiazem (CARDIZEM  SR) 120 mg, Oral, Daily   levothyroxine (SYNTHROID) 150 mcg, Oral, Daily before breakfast   Multiple Vitamins-Minerals (MULTIVITAMINS THER. W/MINERALS) TABS 1 tablet, Daily   omeprazole (PRILOSEC) 20 mg, Oral, 2 times daily before meals   tamsulosin (FLOMAX) 0.4 mg, Oral, 2 times daily   valsartan-hydrochlorothiazide (DIOVAN-HCT) 320-12.5 MG tablet 1 tablet, Oral, Daily   Medications Discontinued During This Encounter  Medication Reason   olmesartan-hydrochlorothiazide (BENICAR HCT) 40-12.5 MG tablet Change in therapy   diltiazem (CARDIZEM CD) 240 MG 24 hr capsule Dose change   oseltamivir (TAMIFLU) 75 MG capsule Completed Course   guaiFENesin (MUCINEX) 600 MG 12 hr tablet     Radiology:   CT Angio Abd/Pel 12/13/2014:   No evidence of aortic dissection or aneurysm. No evidence of pulmonary embolism. Moderate-sized hiatal hernia. Significantly enlarged prostate gland with chronic bladder outlet obstruction. RIGHT inguinal hernia containing fat. Minimal sigmoid diverticulosis.  Cardiac Studies:   Coronary angiogram 10/15/2004: Normal coronary arteries, right dominant circulation.   Echocardiogram 01/18/2020:  Left ventricle cavity is normal in size. Mild concentric hypertrophy of the left ventricle. Normal global wall motion. Normal LV systolic function  with EF 60%. Doppler evidence of grade I (impaired) diastolic dysfunction, normal LAP.  Left atrial cavity is moderately dilated at 47 cc/m2. Aneurysmal interatrial septum without 2D or color Doppler evidence of interatrial shunt.  Moderate (Grade II) mitral regurgitation.  Mild tricuspid regurgitation. Estimated pulmonary artery systolic pressure is 27 mmHg.  No significant change from 12/13/2014.  Event Monitor for 14 days Start date 01/18/2020 - 01/31/2020:  Baseline sample showed Sinus Rhythm w/Interpolated PVC/PVCs (16 in 1 min) with a heart rate of 68 bpm.  There were no patient triggered events.  There was no heart block, no  atrial fibrillation.  PVC burden was 5 percent.  Rare PACs, burden <1 percent.  EKG  EKG 10/01/2022: Normal sinus rhythm at rate of 73 bpm, left axis deviation, left atrial fascicular block.  Right bundle branch block.  Bifascicular block.  Compared to 01/13/2020, no significant change.    EKG 01/08/2020: Sinus tachycardia at rate of 114 bpm, left axis deviation, left anterior fascicular block.  Right bundle branch block.  Poor R wave progression, cannot exclude lateral infarct old.  LVH with repolarization abnormality, cannot exclude lateral ischemia.  PVC.  Assessment     ICD-10-CM   1. Primary hypertension  I10 EKG 12-Lead    2. Atrial tachycardia  I47.19     3.  Bifascicular block  I45.2        Recommendations:   Hunter Key  is a 82 y.o. Caucasian with hypertension, hyperlipidemia, moderate hiatal hernia, hypothyroidism, atrial tachycardia, spontaneously resolved with intravenous metoprolol in 2021, labs within normal limits except for mildly elevated T4, was discharged home.  He was seen by his PCP Dr. Osborne Casco for elevated blood pressure, he had done well on diltiazem CD, as patient complained of elevated blood pressure, he was restarted back on diltiazem CD and is referred back to me for further cardiac evaluation.  1. Primary hypertension Patient referred back to me for evaluation of primary hypertension, fortunately since he was started back on diltiazem CD, blood pressure under excellent control.  He remains completely asymptomatic.  2. Atrial tachycardia I reviewed his records from 2021 when he presented to the emergency room with tachycardia, I suspect he had atrial tachycardia which converted back to sinus rhythm on metoprolol.  He has not had any recurrence of palpitations.  3. Bifascicular block Patient has chronic bifascicular block with left axis deviation with anterior fascicular block and right bundle branch block.  This has remained stable.  He remains  asymptomatic without any dizziness or syncope.  Overall stable from cardiac standpoint, lipids are normal, renal function is normal, I will see him back on a as needed basis.  External labs reviewed.    Adrian Prows, MD, [facility] 10/01/2022, 12:33 PM Office: [phone]

## 2022-10-03 ENCOUNTER — Encounter: Payer: Self-pay | Admitting: Cardiology

## 2022-10-23 DIAGNOSIS — E039 Hypothyroidism, unspecified: Secondary | ICD-10-CM | POA: Diagnosis not present

## 2022-10-23 DIAGNOSIS — E7800 Pure hypercholesterolemia, unspecified: Secondary | ICD-10-CM | POA: Diagnosis not present

## 2023-01-20 DIAGNOSIS — I493 Ventricular premature depolarization: Secondary | ICD-10-CM | POA: Diagnosis not present

## 2023-01-20 DIAGNOSIS — D72819 Decreased white blood cell count, unspecified: Secondary | ICD-10-CM | POA: Diagnosis not present

## 2023-01-20 DIAGNOSIS — R972 Elevated prostate specific antigen [PSA]: Secondary | ICD-10-CM | POA: Diagnosis not present

## 2023-01-20 DIAGNOSIS — M5416 Radiculopathy, lumbar region: Secondary | ICD-10-CM | POA: Diagnosis not present

## 2023-01-20 DIAGNOSIS — I10 Essential (primary) hypertension: Secondary | ICD-10-CM | POA: Diagnosis not present

## 2023-01-20 DIAGNOSIS — I872 Venous insufficiency (chronic) (peripheral): Secondary | ICD-10-CM | POA: Diagnosis not present

## 2023-01-20 DIAGNOSIS — N401 Enlarged prostate with lower urinary tract symptoms: Secondary | ICD-10-CM | POA: Diagnosis not present

## 2023-01-20 DIAGNOSIS — K4090 Unilateral inguinal hernia, without obstruction or gangrene, not specified as recurrent: Secondary | ICD-10-CM | POA: Diagnosis not present

## 2023-01-20 DIAGNOSIS — E039 Hypothyroidism, unspecified: Secondary | ICD-10-CM | POA: Diagnosis not present

## 2023-01-20 DIAGNOSIS — E7800 Pure hypercholesterolemia, unspecified: Secondary | ICD-10-CM | POA: Diagnosis not present

## 2023-01-20 DIAGNOSIS — D692 Other nonthrombocytopenic purpura: Secondary | ICD-10-CM | POA: Diagnosis not present

## 2023-01-20 DIAGNOSIS — M170 Bilateral primary osteoarthritis of knee: Secondary | ICD-10-CM | POA: Diagnosis not present

## 2023-01-23 DIAGNOSIS — H524 Presbyopia: Secondary | ICD-10-CM | POA: Diagnosis not present

## 2023-01-23 DIAGNOSIS — H5203 Hypermetropia, bilateral: Secondary | ICD-10-CM | POA: Diagnosis not present

## 2023-01-23 DIAGNOSIS — H52223 Regular astigmatism, bilateral: Secondary | ICD-10-CM | POA: Diagnosis not present

## 2023-01-27 DIAGNOSIS — H52223 Regular astigmatism, bilateral: Secondary | ICD-10-CM | POA: Diagnosis not present

## 2023-01-27 DIAGNOSIS — H524 Presbyopia: Secondary | ICD-10-CM | POA: Diagnosis not present

## 2023-05-07 DIAGNOSIS — S0033XA Contusion of nose, initial encounter: Secondary | ICD-10-CM | POA: Diagnosis not present

## 2023-07-14 DIAGNOSIS — E785 Hyperlipidemia, unspecified: Secondary | ICD-10-CM | POA: Diagnosis not present

## 2023-07-21 DIAGNOSIS — M5416 Radiculopathy, lumbar region: Secondary | ICD-10-CM | POA: Diagnosis not present

## 2023-07-21 DIAGNOSIS — K449 Diaphragmatic hernia without obstruction or gangrene: Secondary | ICD-10-CM | POA: Diagnosis not present

## 2023-07-21 DIAGNOSIS — M170 Bilateral primary osteoarthritis of knee: Secondary | ICD-10-CM | POA: Diagnosis not present

## 2023-07-21 DIAGNOSIS — E7800 Pure hypercholesterolemia, unspecified: Secondary | ICD-10-CM | POA: Diagnosis not present

## 2023-07-21 DIAGNOSIS — E039 Hypothyroidism, unspecified: Secondary | ICD-10-CM | POA: Diagnosis not present

## 2023-07-21 DIAGNOSIS — Z1339 Encounter for screening examination for other mental health and behavioral disorders: Secondary | ICD-10-CM | POA: Diagnosis not present

## 2023-07-21 DIAGNOSIS — I872 Venous insufficiency (chronic) (peripheral): Secondary | ICD-10-CM | POA: Diagnosis not present

## 2023-07-21 DIAGNOSIS — D692 Other nonthrombocytopenic purpura: Secondary | ICD-10-CM | POA: Diagnosis not present

## 2023-07-21 DIAGNOSIS — I10 Essential (primary) hypertension: Secondary | ICD-10-CM | POA: Diagnosis not present

## 2023-07-21 DIAGNOSIS — R972 Elevated prostate specific antigen [PSA]: Secondary | ICD-10-CM | POA: Diagnosis not present

## 2023-07-21 DIAGNOSIS — Z1331 Encounter for screening for depression: Secondary | ICD-10-CM | POA: Diagnosis not present

## 2023-07-21 DIAGNOSIS — Z0000 Encounter for general adult medical examination without abnormal findings: Secondary | ICD-10-CM | POA: Diagnosis not present

## 2024-02-04 DIAGNOSIS — E7800 Pure hypercholesterolemia, unspecified: Secondary | ICD-10-CM | POA: Diagnosis not present

## 2024-02-04 DIAGNOSIS — E039 Hypothyroidism, unspecified: Secondary | ICD-10-CM | POA: Diagnosis not present

## 2024-02-04 DIAGNOSIS — I10 Essential (primary) hypertension: Secondary | ICD-10-CM | POA: Diagnosis not present

## 2024-07-08 DIAGNOSIS — K4090 Unilateral inguinal hernia, without obstruction or gangrene, not specified as recurrent: Secondary | ICD-10-CM | POA: Diagnosis not present

## 2024-07-22 DIAGNOSIS — R82998 Other abnormal findings in urine: Secondary | ICD-10-CM | POA: Diagnosis not present

## 2024-07-22 DIAGNOSIS — I10 Essential (primary) hypertension: Secondary | ICD-10-CM | POA: Diagnosis not present
